# Patient Record
Sex: Female | Born: 1975
Health system: Southern US, Community
[De-identification: ages and names within clinical notes are randomized; demographics above are authoritative.]

## PROBLEM LIST (undated history)

## (undated) DIAGNOSIS — Z789 Other specified health status: Secondary | ICD-10-CM

## (undated) HISTORY — PX: OTHER SURGICAL HISTORY: SHX169

## (undated) HISTORY — PX: SHOULDER SURGERY: SHX246

## (undated) SURGERY — Surgical Case
Anesthesia: *Unknown

---

## 2007-04-14 ENCOUNTER — Other Ambulatory Visit: Admission: RE | Admit: 2007-04-14 | Discharge: 2007-04-14 | Payer: Self-pay | Admitting: Family Medicine

## 2009-06-05 ENCOUNTER — Other Ambulatory Visit: Admission: RE | Admit: 2009-06-05 | Discharge: 2009-06-05 | Payer: Self-pay | Admitting: Family Medicine

## 2010-05-22 ENCOUNTER — Other Ambulatory Visit
Admission: RE | Admit: 2010-05-22 | Discharge: 2010-05-22 | Payer: Self-pay | Source: Home / Self Care | Admitting: Family Medicine

## 2011-05-15 ENCOUNTER — Emergency Department (HOSPITAL_COMMUNITY)
Admission: EM | Admit: 2011-05-15 | Discharge: 2011-05-15 | Disposition: A | Discharge status: Home / Self Care | Payer: 59 | Source: Home / Self Care | Attending: Family Medicine | Admitting: Family Medicine

## 2011-05-15 ENCOUNTER — Encounter: Payer: Self-pay | Admitting: *Deleted

## 2011-05-15 DIAGNOSIS — J069 Acute upper respiratory infection, unspecified: Secondary | ICD-10-CM

## 2011-05-15 LAB — POCT URINALYSIS DIP (DEVICE)
Glucose, UA: NEGATIVE mg/dL
Nitrite: NEGATIVE
Specific Gravity, Urine: 1.02 (ref 1.005–1.030)

## 2011-05-15 MED ORDER — GUAIFENESIN-CODEINE 100-10 MG/5ML PO SYRP: 5.0000 mL | ORAL_SOLUTION | Freq: Four times a day (QID) | ORAL | Status: AC | PRN

## 2011-05-15 NOTE — ED Notes (Addendum)
C/O "high" fevers (unmeasured) with productive cough starting 3 days ago.  Has been taking Zyrtec-D and Motrin as needed.  Reports improvement, but sxs continue.  C/O aching in bilat shoulders and chest when coughing.  Also c/o sharp pain in low abd when coughing; denies urinary sxs.

## 2011-05-15 NOTE — ED Provider Notes (Addendum)
History     CSN: [id number] Arrival date & time: 05/15/2011  9:31 AM   First MD Initiated Contact with Patient 05/15/11 [phone]      Chief Complaint  Patient presents with  . Fever  . Cough    (Consider location/radiation/quality/duration/timing/severity/associated sxs/prior treatment) HPI Comments: Kristen Sheppard presents for evaluation of persistent cough, body aches, headaches over the last 3 days. She reports her parents as sick contacts.   Patient is a 35 y.o. female presenting with fever and cough. The history is provided by the patient.  Fever Primary symptoms of the febrile illness include fever, fatigue, cough and myalgias. Primary symptoms do not include wheezing or shortness of breath. The current episode started 3 to 5 days ago. This is a new problem. The problem has not changed since onset. The fever began 3 to 5 days ago. The fever has been gradually improving since its onset. The maximum temperature recorded prior to her arrival was unknown. The temperature was taken by an oral thermometer.  The fatigue began 3 to 5 days ago.  The cough is non-productive and dry.  Cough Associated symptoms include chills, rhinorrhea and myalgias. Pertinent negatives include no ear pain, no sore throat, no shortness of breath and no wheezing.    History reviewed. No pertinent past medical history.  Past Surgical History  Procedure Date  . Other surgical history     "growth" on shoulder    No family history on file.  History  Substance Use Topics  . Smoking status: Never Smoker   . Smokeless tobacco: Not on file  . Alcohol Use:     OB History    Grav Para Term Preterm Abortions TAB SAB Ect Mult Living                  Review of Systems  Constitutional: Positive for fever, chills and fatigue.  HENT: Positive for congestion, rhinorrhea and sneezing. Negative for ear pain, sore throat and trouble swallowing.   Eyes: Negative.   Respiratory: Positive for cough. Negative for  shortness of breath and wheezing.   Cardiovascular: Negative.   Gastrointestinal: Negative.   Genitourinary: Positive for urgency and frequency.  Musculoskeletal: Positive for myalgias.  Skin: Negative.     Allergies  Review of patient's allergies indicates no known allergies.  Home Medications   Current Outpatient Rx  Name Route Sig Dispense Refill  . GUAIFENESIN-CODEINE 100-10 MG/5ML PO SYRP Oral Take 5 mLs by mouth every 6 (six) hours as needed for cough or congestion. 120 mL 0    BP 122/86  Pulse 54  Temp(Src) 98.6 F (37 C) (Oral)  Resp 18  SpO2 100%  LMP 05/01/2011  Physical Exam  Nursing note and vitals reviewed. Constitutional: She is oriented to person, place, and time. She appears well-developed and well-nourished.  HENT:  Head: Normocephalic and atraumatic.  Right Ear: Tympanic membrane and external ear normal.  Left Ear: Tympanic membrane and external ear normal.  Nose: Nose normal.  Mouth/Throat: Uvula is midline, oropharynx is clear and moist and mucous membranes are normal.  Eyes: EOM are normal. Pupils are equal, round, and reactive to light.  Neck: Normal range of motion.  Cardiovascular: Normal rate and regular rhythm.   Pulmonary/Chest: Effort normal and breath sounds normal. She has no wheezes. She has no rales.  Neurological: She is alert and oriented to person, place, and time.  Skin: Skin is warm and dry.    ED Course  Procedures (including critical care time)  Labs Reviewed  POCT URINALYSIS DIP (DEVICE) - Abnormal; Notable for the following:    Bilirubin Urine SMALL (*)    Ketones, ur 40 (*)    Hgb urine dipstick SMALL (*)    Protein, ur 100 (*)    All other components within normal limits  POCT PREGNANCY, URINE  POCT PREGNANCY, URINE  POCT URINALYSIS DIPSTICK   No results found.   1. URI (upper respiratory infection)       MDM  Labs reviewed.        Richardo Priest, MD 05/15/11 1031  Richardo Priest, MD 05/15/11 1031

## 2012-06-05 ENCOUNTER — Other Ambulatory Visit: Payer: Self-pay | Admitting: Family Medicine

## 2012-06-05 DIAGNOSIS — Z1231 Encounter for screening mammogram for malignant neoplasm of breast: Secondary | ICD-10-CM

## 2012-06-05 DIAGNOSIS — Z803 Family history of malignant neoplasm of breast: Secondary | ICD-10-CM

## 2012-06-29 ENCOUNTER — Ambulatory Visit
Admission: RE | Admit: 2012-06-29 | Discharge: 2012-06-29 | Disposition: A | Discharge status: Home / Self Care | Payer: 59 | Source: Ambulatory Visit | Attending: Family Medicine | Admitting: Family Medicine

## 2012-06-29 DIAGNOSIS — Z1231 Encounter for screening mammogram for malignant neoplasm of breast: Secondary | ICD-10-CM

## 2012-06-29 DIAGNOSIS — Z803 Family history of malignant neoplasm of breast: Secondary | ICD-10-CM

## 2013-06-07 NOTE — L&D Delivery Note (Signed)
Delivery Note At 4:11 PM a viable and healthy female was delivered via Vaginal, Spontaneous Delivery (Presentation: Left Occiput Anterior).  APGAR: 9, 9; weight pending.   Placenta status: Intact, Spontaneous.  Cord: 3 vessels with the following complications: None.  Anesthesia: Epidural  Episiotomy: None Lacerations: 1st degree;Vaginal Suture Repair: 3.0 vicryl 4-0 monocryl Est. Blood Loss (mL):    Mom to postpartum.  Baby to Couplet care / Skin to Skin.  Kileen Lange H. 05/23/2014, 4:45 PM

## 2013-06-08 ENCOUNTER — Other Ambulatory Visit (HOSPITAL_COMMUNITY)
Admission: RE | Admit: 2013-06-08 | Discharge: 2013-06-08 | Disposition: A | Discharge status: Home / Self Care | Payer: 59 | Source: Ambulatory Visit | Attending: Family Medicine | Admitting: Family Medicine

## 2013-06-08 ENCOUNTER — Other Ambulatory Visit: Payer: Self-pay | Admitting: Family Medicine

## 2013-06-08 DIAGNOSIS — Z124 Encounter for screening for malignant neoplasm of cervix: Secondary | ICD-10-CM | POA: Insufficient documentation

## 2013-10-31 ENCOUNTER — Other Ambulatory Visit: Payer: Self-pay | Admitting: Obstetrics and Gynecology

## 2013-11-27 LAB — OB RESULTS CONSOLE GC/CHLAMYDIA
Chlamydia: NEGATIVE
GC PROBE AMP, GENITAL: NEGATIVE

## 2013-11-27 LAB — OB RESULTS CONSOLE RUBELLA ANTIBODY, IGM: Rubella: IMMUNE

## 2013-11-27 LAB — OB RESULTS CONSOLE HEPATITIS B SURFACE ANTIGEN: Hepatitis B Surface Ag: NEGATIVE

## 2013-11-27 LAB — OB RESULTS CONSOLE ABO/RH: RH Type: POSITIVE

## 2013-11-27 LAB — OB RESULTS CONSOLE HIV ANTIBODY (ROUTINE TESTING): HIV: NONREACTIVE

## 2013-11-27 LAB — OB RESULTS CONSOLE ANTIBODY SCREEN: Antibody Screen: NEGATIVE

## 2013-11-27 LAB — OB RESULTS CONSOLE RPR: RPR: NONREACTIVE

## 2014-01-11 ENCOUNTER — Inpatient Hospital Stay (HOSPITAL_COMMUNITY): Admission: AD | Admit: 2014-01-11 | Payer: 59 | Source: Ambulatory Visit | Admitting: Obstetrics and Gynecology

## 2014-04-16 ENCOUNTER — Institutional Professional Consult (permissible substitution): Payer: Self-pay | Admitting: Pediatrics

## 2014-04-30 ENCOUNTER — Other Ambulatory Visit: Payer: Self-pay | Admitting: Obstetrics & Gynecology

## 2014-04-30 LAB — OB RESULTS CONSOLE GBS: GBS: NEGATIVE

## 2014-05-23 ENCOUNTER — Encounter (HOSPITAL_COMMUNITY): Payer: Self-pay

## 2014-05-23 ENCOUNTER — Inpatient Hospital Stay (HOSPITAL_COMMUNITY): Payer: 59 | Admitting: Anesthesiology

## 2014-05-23 ENCOUNTER — Inpatient Hospital Stay (HOSPITAL_COMMUNITY)
Admission: AD | Admit: 2014-05-23 | Discharge: 2014-05-25 | DRG: 775 | Disposition: A | Discharge status: Home / Self Care | Payer: 59 | Source: Ambulatory Visit | Attending: Obstetrics and Gynecology | Admitting: Obstetrics and Gynecology

## 2014-05-23 DIAGNOSIS — Z3A39 39 weeks gestation of pregnancy: Secondary | ICD-10-CM | POA: Diagnosis present

## 2014-05-23 DIAGNOSIS — O09513 Supervision of elderly primigravida, third trimester: Secondary | ICD-10-CM

## 2014-05-23 HISTORY — DX: Other specified health status: Z78.9

## 2014-05-23 LAB — POCT FERN TEST: POCT Fern Test: POSITIVE

## 2014-05-23 LAB — CBC
HEMATOCRIT: 39.1 % (ref 36.0–46.0)
HEMOGLOBIN: 13 g/dL (ref 12.0–15.0)
MCH: 28 pg (ref 26.0–34.0)
MCHC: 33.2 g/dL (ref 30.0–36.0)
MCV: 84.3 fL (ref 78.0–100.0)
Platelets: 237 10*3/uL (ref 150–400)
RBC: 4.64 MIL/uL (ref 3.87–5.11)
RDW: 14.3 % (ref 11.5–15.5)
WBC: 15.6 10*3/uL — ABNORMAL HIGH (ref 4.0–10.5)

## 2014-05-23 LAB — TYPE AND SCREEN
ABO/RH(D): A POS
Antibody Screen: NEGATIVE

## 2014-05-23 LAB — ABO/RH: ABO/RH(D): A POS

## 2014-05-23 LAB — RPR

## 2014-05-23 MED ORDER — INFLUENZA VAC SPLIT QUAD 0.5 ML IM SUSY: 0.5000 mL | PREFILLED_SYRINGE | INTRAMUSCULAR | Status: DC

## 2014-05-23 MED ORDER — DIBUCAINE 1 % RE OINT: 1.0000 "application " | TOPICAL_OINTMENT | RECTAL | Status: DC | PRN

## 2014-05-23 MED ORDER — ONDANSETRON HCL 4 MG/2ML IJ SOLN: 4.0000 mg | INTRAMUSCULAR | Status: DC | PRN

## 2014-05-23 MED ORDER — EPHEDRINE 5 MG/ML INJ: 10.0000 mg | INTRAVENOUS | Status: DC | PRN

## 2014-05-23 MED ORDER — PHENYLEPHRINE 40 MCG/ML (10ML) SYRINGE FOR IV PUSH (FOR BLOOD PRESSURE SUPPORT)
80.0000 ug | PREFILLED_SYRINGE | INTRAVENOUS | Status: DC | PRN
  Filled 2014-05-23: qty 10

## 2014-05-23 MED ORDER — LACTATED RINGERS IV SOLN
500.0000 mL | INTRAVENOUS | Status: DC | PRN
  Administered 2014-05-23: 500 mL via INTRAVENOUS
  Administered 2014-05-23: 300 mL via INTRAVENOUS
  Administered 2014-05-23: 250 mL via INTRAVENOUS

## 2014-05-23 MED ORDER — CITRIC ACID-SODIUM CITRATE 334-500 MG/5ML PO SOLN
30.0000 mL | ORAL | Status: DC | PRN
Start: 2014-05-23 — End: 2014-05-23

## 2014-05-23 MED ORDER — OXYCODONE-ACETAMINOPHEN 5-325 MG PO TABS: 2.0000 | ORAL_TABLET | ORAL | Status: DC | PRN

## 2014-05-23 MED ORDER — SENNOSIDES-DOCUSATE SODIUM 8.6-50 MG PO TABS
2.0000 | ORAL_TABLET | ORAL | Status: DC
  Administered 2014-05-23 – 2014-05-25 (×2): 2 via ORAL
  Filled 2014-05-23 (×2): qty 2

## 2014-05-23 MED ORDER — ONDANSETRON HCL 4 MG/2ML IJ SOLN: 4.0000 mg | Freq: Four times a day (QID) | INTRAMUSCULAR | Status: DC | PRN

## 2014-05-23 MED ORDER — FENTANYL 2.5 MCG/ML BUPIVACAINE 1/10 % EPIDURAL INFUSION (WH - ANES)
14.0000 mL/h | INTRAMUSCULAR | Status: DC | PRN
  Administered 2014-05-23: 14 mL/h via EPIDURAL
  Filled 2014-05-23: qty 125

## 2014-05-23 MED ORDER — IBUPROFEN 600 MG PO TABS
600.0000 mg | ORAL_TABLET | Freq: Four times a day (QID) | ORAL | Status: DC
  Administered 2014-05-23 – 2014-05-25 (×8): 600 mg via ORAL
  Filled 2014-05-23 (×8): qty 1

## 2014-05-23 MED ORDER — OXYTOCIN 40 UNITS IN LACTATED RINGERS INFUSION - SIMPLE MED
1.0000 m[IU]/min | INTRAVENOUS | Status: DC
  Administered 2014-05-23: 2 m[IU]/min via INTRAVENOUS
  Filled 2014-05-23: qty 1000

## 2014-05-23 MED ORDER — DIPHENHYDRAMINE HCL 50 MG/ML IJ SOLN: 12.5000 mg | INTRAMUSCULAR | Status: DC | PRN

## 2014-05-23 MED ORDER — LACTATED RINGERS IV SOLN
INTRAVENOUS | Status: DC
  Administered 2014-05-23 (×3): via INTRAVENOUS

## 2014-05-23 MED ORDER — LACTATED RINGERS IV SOLN: 500.0000 mL | Freq: Once | INTRAVENOUS | Status: DC

## 2014-05-23 MED ORDER — FLEET ENEMA 7-19 GM/118ML RE ENEM: 1.0000 | ENEMA | RECTAL | Status: DC | PRN

## 2014-05-23 MED ORDER — ZOLPIDEM TARTRATE 5 MG PO TABS: 5.0000 mg | ORAL_TABLET | Freq: Every evening | ORAL | Status: DC | PRN

## 2014-05-23 MED ORDER — LANOLIN HYDROUS EX OINT: TOPICAL_OINTMENT | CUTANEOUS | Status: DC | PRN

## 2014-05-23 MED ORDER — LIDOCAINE HCL (PF) 1 % IJ SOLN
INTRAMUSCULAR | Status: DC | PRN
  Administered 2014-05-23 (×2): 8 mL

## 2014-05-23 MED ORDER — TETANUS-DIPHTH-ACELL PERTUSSIS 5-2.5-18.5 LF-MCG/0.5 IM SUSP: 0.5000 mL | Freq: Once | INTRAMUSCULAR | Status: DC

## 2014-05-23 MED ORDER — BENZOCAINE-MENTHOL 20-0.5 % EX AERO: 1.0000 "application " | INHALATION_SPRAY | CUTANEOUS | Status: DC | PRN

## 2014-05-23 MED ORDER — PRENATAL MULTIVITAMIN CH
1.0000 | ORAL_TABLET | Freq: Every day | ORAL | Status: DC
  Administered 2014-05-24 – 2014-05-25 (×2): 1 via ORAL
  Filled 2014-05-23 (×2): qty 1

## 2014-05-23 MED ORDER — OXYCODONE-ACETAMINOPHEN 5-325 MG PO TABS: 1.0000 | ORAL_TABLET | ORAL | Status: DC | PRN

## 2014-05-23 MED ORDER — ONDANSETRON HCL 4 MG PO TABS: 4.0000 mg | ORAL_TABLET | ORAL | Status: DC | PRN

## 2014-05-23 MED ORDER — OXYTOCIN 40 UNITS IN LACTATED RINGERS INFUSION - SIMPLE MED: 62.5000 mL/h | INTRAVENOUS | Status: DC

## 2014-05-23 MED ORDER — LIDOCAINE HCL (PF) 1 % IJ SOLN
30.0000 mL | INTRAMUSCULAR | Status: DC | PRN
  Filled 2014-05-23: qty 30

## 2014-05-23 MED ORDER — PHENYLEPHRINE 40 MCG/ML (10ML) SYRINGE FOR IV PUSH (FOR BLOOD PRESSURE SUPPORT): 80.0000 ug | PREFILLED_SYRINGE | INTRAVENOUS | Status: DC | PRN

## 2014-05-23 MED ORDER — TERBUTALINE SULFATE 1 MG/ML IJ SOLN: 0.2500 mg | Freq: Once | INTRAMUSCULAR | Status: DC | PRN

## 2014-05-23 MED ORDER — ACETAMINOPHEN 325 MG PO TABS: 650.0000 mg | ORAL_TABLET | ORAL | Status: DC | PRN

## 2014-05-23 MED ORDER — OXYTOCIN BOLUS FROM INFUSION
500.0000 mL | INTRAVENOUS | Status: DC
  Administered 2014-05-23: 500 mL via INTRAVENOUS

## 2014-05-23 MED ORDER — DIPHENHYDRAMINE HCL 25 MG PO CAPS: 25.0000 mg | ORAL_CAPSULE | Freq: Four times a day (QID) | ORAL | Status: DC | PRN

## 2014-05-23 MED ORDER — WITCH HAZEL-GLYCERIN EX PADS: 1.0000 "application " | MEDICATED_PAD | CUTANEOUS | Status: DC | PRN

## 2014-05-23 MED ORDER — FENTANYL 2.5 MCG/ML BUPIVACAINE 1/10 % EPIDURAL INFUSION (WH - ANES)
INTRAMUSCULAR | Status: DC | PRN
  Administered 2014-05-23: 14 mL/h via EPIDURAL

## 2014-05-23 MED ORDER — SIMETHICONE 80 MG PO CHEW: 80.0000 mg | CHEWABLE_TABLET | ORAL | Status: DC | PRN

## 2014-05-23 NOTE — H&P (Signed)
Tynisa Grigg is a 38 y.o. female presenting for leaking fluid  38 Yo G1P0 @ 39+3 presents to MAU c/o leaking fluid and was confirmed to be leaking amniotic fluid. Had ultrasound @ 37 weeks that showed oligohydramnios with an AFI of 7.85 and an enlarged fetal bladder, FU AFI 4 days later was normal @ 11.26. Her husband is Hep B+ but the patient has tested negative on multiple occassions History OB History    Gravida Para Term Preterm AB TAB SAB Ectopic Multiple Living   1              Past Medical History  Diagnosis Date  . Medical history non-contributory    Past Surgical History  Procedure Laterality Date  . Other surgical history      "growth" on shoulder  . Shoulder surgery Right    Family History: family history is not on file. Social History:  reports that she has never smoked. She does not have any smokeless tobacco history on file. She reports that she does not drink alcohol or use illicit drugs.   Prenatal Transfer Tool  Maternal Diabetes: No Genetic Screening: Normal Maternal Ultrasounds/Referrals: Normal except for oligohydramnios that resolved after 4 days. Fetal bladder also normal appearing on 2nd US Fetal Ultrasounds or other Referrals:  None Maternal Substance Abuse:  No Significant Maternal Medications:  None Significant Maternal Lab Results:  None Other Comments:  None  ROS: as above  Dilation: 7.5 Effacement (%): 100 Station: 0 Exam by:: foley,rn Blood pressure 113/63, pulse 71, temperature 98 F (36.7 C), temperature source Oral, resp. rate 20, height 5' (1.524 m), weight 62.596 kg (138 lb), SpO2 99 %. Exam Physical Exam  Prenatal labs: ABO, Rh: --/--/A POS, A POS (12/17 0330) Antibody: NEG (12/17 0330) Rubella: Immune (06/23 0000) RPR: Nonreactive (06/23 0000)  HBsAg: Negative (06/23 0000)  HIV: Non-reactive (06/23 0000)  GBS: Negative (11/24 0000)   Assessment/Plan: 1) Admit 2) Epidural on request 3) Pitocin Augmentation as  needed   Agastya Meister H. 05/23/2014, 11:39 AM

## 2014-05-23 NOTE — MAU Provider Note (Signed)
Ms. Kristen Sheppard is a 38 y.o. G1P0 at 1556w3d  who presents to MAU today complaining contractions and LOF since 0215. The patient denies complication with the pregnancy. She denies vaginal bleeding today. She reports good fetal movement.   BP 121/83 mmHg  Pulse 88  Temp(Src) 98.3 F (36.8 C) (Oral)  Resp 20  Ht 5' (1.524 m)  Wt 138 lb (62.596 kg)  BMI 26.95 kg/m2 GENERAL: Well-developed, well-nourished female in no acute distress.  HEENT: Normocephalic, atraumatic.  LUNGS: Normal effort HEART: Regular rate  ABDOMEN: Soft, nontender, nondistended.  PELVIC: Normal external female genitalia. Vagina is pink and rugated.  Normal discharge.  positive pooling. Gravid uterus.   EXTREMITIES: No cyanosis, clubbing, or edema Dilation: 2.5 Effacement (%): 50 Cervical Position: Middle Station: -3 Presentation: Vertex Exam by:: Judeth HornErin Lawrence RNC  Fern - positive  Fetal Monitoring:  Baseline: 130 bpm, moderate variability, + accelerations, no decelerations Contractions: q 3-4 minutes  A: SROM  P: Report given to RN to contact MD on call for further instructions  Marny LowensteinJulie N Dawood Spitler, PA-C 05/23/2014 3:12 AM

## 2014-05-23 NOTE — Anesthesia Procedure Notes (Signed)
Epidural Patient location during procedure: OB Start time: 05/23/2014 8:17 AM End time: 05/23/2014 8:21 AM  Staffing Anesthesiologist: Leilani AbleHATCHETT, Hassan Blackshire Performed by: anesthesiologist   Preanesthetic Checklist Completed: patient identified, surgical consent, pre-op evaluation, timeout performed, IV checked, risks and benefits discussed and monitors and equipment checked  Epidural Patient position: sitting Prep: site prepped and draped and DuraPrep Patient monitoring: continuous pulse ox and blood pressure Approach: midline Location: L3-L4 Injection technique: LOR air  Needle:  Needle type: Tuohy  Needle gauge: 17 G Needle length: 9 cm and 9 Needle insertion depth: 4 cm Catheter type: closed end flexible Catheter size: 19 Gauge Catheter at skin depth: 10 and 7 cm Test dose: negative and Other  Assessment Sensory level: T10 Events: blood not aspirated, injection not painful, no injection resistance, negative IV test and no paresthesia  Additional Notes Reason for block:procedure for pain

## 2014-05-23 NOTE — Anesthesia Preprocedure Evaluation (Signed)

## 2014-05-23 NOTE — MAU Note (Signed)
Leaking fluid since 215 am this morning. Contractions tonight. Denies vaginal bleeding. Positive fetal movement.

## 2014-05-24 LAB — CBC
HCT: 30 % — ABNORMAL LOW (ref 36.0–46.0)
MCH: 28.2 pg (ref 26.0–34.0)
MCHC: 33.3 g/dL (ref 30.0–36.0)
MCV: 84.5 fL (ref 78.0–100.0)
Platelets: 179 10*3/uL (ref 150–400)
RBC: 3.55 MIL/uL — AB (ref 3.87–5.11)
RDW: 14.2 % (ref 11.5–15.5)
WBC: 16.6 10*3/uL — ABNORMAL HIGH (ref 4.0–10.5)

## 2014-05-24 NOTE — Lactation Note (Signed)
This note was copied from the chart of Kristen Sheppard. Lactation Consultation Note New mom has small breast, latches better on Lt. Breast per mom d/t Rt. Nipple doesn't evert as much as Lt. Nipple shells given to be worn between feedings in bra. Mom appears to be receptive to suggestions. Instructed to roll nipples in finger tips to help evert more. hand expressions taught and got out 3ml. On foley cup. Hand pump given and encouraged to pre-pump Rt. Nipple to help evert more for a deeper latch, baby has small mouth so needs to be adjusted and assist in chin. Baby pops off and on frequently. Mom has occassionally pinching. No bruising noted at this time to nipples. Encouraged a deep latch. Discussed different positioning options. Mom encouraged to feed baby 8-12 times/24 hours and with feeding cues.  Educated about newborn behavior. Encouraged comfort during BF so colostrum flows better and mom will enjoy the feeding longer. Taking deep breaths and breast massage during BF. Referred to Baby and Me Book in Breastfeeding section Pg. 22-23 for position options and Proper latch demonstration.Mom encouraged to do skin-to-skin.Mom encouraged to waken baby for feeds. WH/LC brochure given w/resources, support groups and LC services.  Patient Name: Kristen Maryagnes AmosBounpasert Felch WUJWJ'XToday's Date: 05/24/2014 Reason for consult: Initial assessment   Maternal Data Has patient been taught Hand Expression?: Yes Does the patient have breastfeeding experience prior to this delivery?: No  Feeding Feeding Type: Breast Fed Length of feed: 20 min  LATCH Score/Interventions Latch: Repeated attempts needed to sustain latch, nipple held in mouth throughout feeding, stimulation needed to elicit sucking reflex. Intervention(s): Adjust position;Assist with latch;Breast massage;Breast compression  Audible Swallowing: A few with stimulation Intervention(s): Skin to skin;Hand expression;Alternate breast  massage  Type of Nipple: Everted at rest and after stimulation  Comfort (Breast/Nipple): Soft / non-tender     Hold (Positioning): Assistance needed to correctly position infant at breast and maintain latch. Intervention(s): Breastfeeding basics reviewed;Support Pillows;Position options;Skin to skin  LATCH Score: 7  Lactation Tools Discussed/Used Tools: Shells;Pump Shell Type: Inverted Breast pump type: Manual Pump Review: Milk Storage;Setup, frequency, and cleaning Initiated by:: Peri JeffersonL. Brix Brearley RN Date initiated:: 05/24/14   Consult Status Consult Status: Follow-up Date: 07/26/13 Follow-up type: In-patient    Charyl DancerCARVER, Asianna Brundage G 05/24/2014, 4:39 AM

## 2014-05-24 NOTE — Anesthesia Postprocedure Evaluation (Signed)
Anesthesia Post Note  Patient: Kristen Sheppard  Procedure(s) Performed: * No procedures listed *  Anesthesia type: Epidural  Patient location: Mother/Baby  Post pain: Pain level controlled  Post assessment: Post-op Vital signs reviewed  Last Vitals:  Filed Vitals:   05/24/14 0549  BP: 118/70  Pulse: 85  Temp: 36.7 C  Resp:     Post vital signs: Reviewed  Level of consciousness: awake  Complications: No apparent anesthesia complications

## 2014-05-24 NOTE — Progress Notes (Signed)
Post Partum Day 1 Subjective: no complaints, up ad lib, voiding, tolerating PO, + flatus and breastfeeding  Objective: Blood pressure 118/70, pulse 85, temperature 98 F (36.7 C), temperature source Oral, resp. rate 16, height 5' (1.524 m), weight 62.596 kg (138 lb), SpO2 98 %, unknown if currently breastfeeding.  Physical Exam:  General: alert, cooperative, appears stated age and no distress Lochia: appropriate Uterine Fundus: firm perineum: healing well, no significant drainage, no dehiscence DVT Evaluation: No evidence of DVT seen on physical exam. Negative Homan's sign. No cords or calf tenderness.   Recent Labs  05/23/14 0330 05/24/14 0605  HGB 13.0 REPEATED TO VERIFY  HCT 39.1 30.0*    Assessment/Plan: Plan for discharge tomorrow and Breastfeeding   LOS: 1 day   Kenyona Rena STACIA 05/24/2014, 9:43 AM

## 2014-05-24 NOTE — Lactation Note (Signed)
This note was copied from the chart of Kristen Danialle Tsang. Lactation Consultation Note  Patient Name: Kristen Sheppard ZOXWR'UToday's Date: 05/24/2014 Reason for consult: Follow-up assessment Mom reports baby has been sleepy and not wanting to BF this morning. Baby has had several feedings during the evening/night. Mom hand expressed and cup fed approx 1 ml of colostrum since baby would not latch. At this visit, baby easily awakened. Mom initially attempted to latch in football hold however baby was not sustaining good depth. Changed to cross cradle and baby was able to latch sustaining a good suckling pattern. Some audible swallows. Basic teaching reviewed. Advised after 24 hours baby should be at the breast 8-12 times in 24 hours. Cluster feeding discussed. Encouraged Mom to call for assist if baby not waking to BF or will not latch.   Maternal Data    Feeding Feeding Type: Breast Fed  LATCH Score/Interventions Latch: Grasps breast easily, tongue down, lips flanged, rhythmical sucking.  Audible Swallowing: A few with stimulation  Type of Nipple: Everted at rest and after stimulation  Comfort (Breast/Nipple): Soft / non-tender     Hold (Positioning): Assistance needed to correctly position infant at breast and maintain latch. Intervention(s): Breastfeeding basics reviewed;Support Pillows;Position options;Skin to skin  LATCH Score: 8  Lactation Tools Discussed/Used     Consult Status Consult Status: Follow-up Date: 05/25/14 Follow-up type: In-patient    Alfred LevinsGranger, Tausha Milhoan Ann 05/24/2014, 2:30 PM

## 2014-05-25 ENCOUNTER — Ambulatory Visit: Payer: Self-pay

## 2014-05-25 ENCOUNTER — Encounter (HOSPITAL_COMMUNITY): Payer: Self-pay | Admitting: *Deleted

## 2014-05-25 MED ORDER — IBUPROFEN 600 MG PO TABS: 600.0000 mg | ORAL_TABLET | Freq: Four times a day (QID) | ORAL | Status: AC | PRN

## 2014-05-25 NOTE — Progress Notes (Signed)
Post Partum Day 2 Subjective: no complaints, up ad lib, voiding and tolerating PO  Objective: Blood pressure 115/68, pulse 67, temperature 97.8 F (36.6 C), temperature source Oral, resp. rate 18, height 5' (1.524 m), weight 62.596 kg (138 lb), SpO2 98 %, unknown if currently breastfeeding.  Physical Exam:  General: alert, cooperative and no distress Lochia: appropriate Uterine Fundus: firm DVT Evaluation: No evidence of DVT seen on physical exam. Negative Homan's sign. No cords or calf tenderness.   Recent Labs  05/23/14 0330 05/24/14 0605  HGB 13.0 REPEATED TO VERIFY  HCT 39.1 30.0*    Assessment/Plan: Discharge home and Breastfeeding   LOS: 2 days   Denyse Fillion STACIA 05/25/2014, 8:21 AM

## 2014-05-25 NOTE — Lactation Note (Signed)
This note was copied from the chart of Girl Colisha Michael. Lactation Consultation Note  Patient Name: Girl Maryagnes AmosBounpasert Scholes ZOXWR'UToday's Date: 05/25/2014 Reason for consult: Follow-up assessment;Hyperbilirubinemia;Infant < 6lbs Baby 42 hours of life. Mom reports baby just finished nursing. Mom denies nipple pain and is able to easily express colostrum. Enc mom to call out at next feeding for LC to see a latch due to baby's elevated bilirubin level.  Maternal Data    Feeding Feeding Type: Breast Fed Length of feed: 10 min  LATCH Score/Interventions Latch: Grasps breast easily, tongue down, lips flanged, rhythmical sucking.  Audible Swallowing: A few with stimulation  Type of Nipple: Everted at rest and after stimulation  Comfort (Breast/Nipple): Soft / non-tender     Hold (Positioning): No assistance needed to correctly position infant at breast.  LATCH Score: 9  Lactation Tools Discussed/Used     Consult Status Consult Status: PRN Follow-up type: In-patient    Geralynn OchsWILLIARD, Giulio Bertino 05/25/2014, 12:12 PM

## 2014-05-25 NOTE — Discharge Summary (Signed)
Obstetric Discharge Summary Reason for Admission: onset of labor Prenatal Procedures: NST Intrapartum Procedures: spontaneous vaginal delivery Postpartum Procedures: none Complications-Operative and Postpartum: none HEMOGLOBIN  Date Value Ref Range Status  05/24/2014 REPEATED TO VERIFY 12.0 - 15.0 g/dL Final    Comment:    DELTA CHECK NOTED   HCT  Date Value Ref Range Status  05/24/2014 30.0* 36.0 - 46.0 % Final    Physical Exam:  General: alert, cooperative and no distress Lochia: appropriate Uterine Fundus: firm DVT Evaluation: No evidence of DVT seen on physical exam. Negative Homan's sign. No cords or calf tenderness.  Discharge Diagnoses: Term Pregnancy-delivered  Discharge Information: Date: 05/25/2014 Activity: pelvic rest Diet: routine Medications: Ibuprofen Condition: stable Instructions: refer to practice specific booklet Discharge to: home   Newborn Data: Live born female  Birth Weight: 6 lb 4.7 oz (2855 g) APGAR: 9, 9  Home with mother.  Essie HartINN, Keaundre Thelin STACIA 05/25/2014, 8:24 AM

## 2014-05-25 NOTE — Lactation Note (Signed)
This note was copied from the chart of Kristen Sheppard. Lactation Consultation Note  Patient Name: Kristen Sheppard AVWUJ'WToday's Date: 05/25/2014 Reason for consult: Follow-up assessment Baby 44 hours of life. Baby staying due to hyperbilirubinemia. Set mom up with DEBP. Mom able to express drops, but dried before finished pumping. Enc mom to give drops to baby as able. Mom wanting to only give EBM for supplementation at this time. Assisted mom to hand express drops of colostrum and demonstrated to mom how to give to baby. Discussed with mom that baby is sleepy due to high level of bilirubin. Discussed with mom the need to enc baby to nurse often by keeping baby close to breasts, while still on phototherapy. Demonstrated to mom that baby was not cueing to nurse, but after attempting, baby nursed eagerly.   Plan is for mom to offer breast, hand expressing before latching baby to enc baby to latch deeply. Then to supplement with EBM or formula according to supplementation guidelines. Mom wants to offer only EBM for now. Discussed with mom that she needs to feed earlier rather than later, waiting no more than 3 hours between feedings, and if after using DEBP 3 times total she is not seeing increases in EBM, she should supplement with formula. Plan is also to post-pump after each feeding, followed by hand expression.   Discussed assessment, interventions, and plan with patient's [facility] RN. Enc mom to call out for assistance as needed. Also discussed methods of giving EBM to baby using finger and curve-tipped syringe.   Maternal Data    Feeding Feeding Type: Breast Fed Length of feed:  (LC assessed first 15 minutes of breastfeed. )  LATCH Score/Interventions Latch: Grasps breast easily, tongue down, lips flanged, rhythmical sucking. Intervention(s): Assist with latch;Breast compression  Audible Swallowing: A few with stimulation Intervention(s): Hand expression;Skin to  skin  Type of Nipple: Everted at rest and after stimulation  Comfort (Breast/Nipple): Soft / non-tender     Hold (Positioning): No assistance needed to correctly position infant at breast. Intervention(s): Breastfeeding basics reviewed;Support Pillows  LATCH Score: 9  Lactation Tools Discussed/Used Pump Review: Setup, frequency, and cleaning;Milk Storage Initiated by:: JW Date initiated:: 05/25/14   Consult Status Consult Status: Follow-up Date: 05/26/14 Follow-up type: In-patient    Kristen Sheppard 05/25/2014, 12:21 PM

## 2014-05-26 ENCOUNTER — Ambulatory Visit: Payer: Self-pay

## 2014-05-26 NOTE — Lactation Note (Signed)
This note was copied from the chart of Kristen Sheppard. Lactation Consultation Note  Patient Name: Kristen Maryagnes AmosBounpasert Geron WNUUV'OToday's Date: 05/26/2014 Reason for consult: Follow-up assessment;Hyperbilirubinemia Baby 65 hours of life. [facility]. Baby deeply latch, suckling rhythmically with a few swallows noted. Mom states that she has been putting baby to breasts in between supplementing, and baby has nurse for up to an hour at a time. Enc mom to put baby to breast first, then to supplement and post-pump. Mom states that she has been pumping, except for sleeping after 2am last night, and so far is still just getting drops. Enc mom to continue her nursing and pumping routine, and discussed supply and demand and when milk usually comes in. Parents state that they will call insurance about DEBP on Monday. Discussed 2-week rental with hospital pump and gave paper-work to fill out. Parents will call for pump if baby is D/C'd today.   Maternal Data    Feeding Feeding Type:  ([facility] Nipple Type: Slow - flow  LATCH Score/Interventions Latch: Grasps breast easily, tongue down, lips flanged, rhythmical sucking.  Audible Swallowing: A few with stimulation  Type of Nipple: Everted at rest and after stimulation  Comfort (Breast/Nipple): Soft / non-tender     Hold (Positioning): No assistance needed to correctly position infant at breast.  [facility] LLC[facility] Score: 9  Lactation Tools Discussed/Used     Consult Status      Geralynn OchsWILLIARD, Taliah Porche 05/26/2014, 9:25 AM

## 2014-05-26 NOTE — Lactation Note (Signed)
This note was copied from the chart of Kristen Britton Doughman. Lactation Consultation Note  Patient Name: Kristen Sheppard ZOXWR'UToday's Date: 05/26/2014 Reason for consult: Follow-up assessment Baby 66 hours of life. Parents given DEBP rental. Reviewed engorgement prevention/treatment. Mom aware of OP/BFSG and LC phone line assistance after D/C. Referred parents to Baby and Me booklet for number of diapers to expect by day of life and EBM storage guidelines. Enc mom to call for assistance as needed.   Maternal Data    Feeding Feeding Type:  ([facility] Nipple Type: Slow - flow  LATCH Score/Interventions Latch: Grasps breast easily, tongue down, lips flanged, rhythmical sucking.  Audible Swallowing: A few with stimulation  Type of Nipple: Everted at rest and after stimulation  Comfort (Breast/Nipple): Soft / non-tender     Hold (Positioning): No assistance needed to correctly position infant at breast.  LATCH Score: 9  Lactation Tools Discussed/Used     Consult Status Consult Status: Complete Follow-up type: In-patient    Kristen Sheppard, Kristen Sheppard 05/26/2014, 10:17 AM

## 2015-03-05 ENCOUNTER — Other Ambulatory Visit: Payer: Self-pay | Admitting: Obstetrics and Gynecology

## 2015-03-05 LAB — OB RESULTS CONSOLE ANTIBODY SCREEN: Antibody Screen: NEGATIVE

## 2015-03-05 LAB — OB RESULTS CONSOLE RUBELLA ANTIBODY, IGM: RUBELLA: IMMUNE

## 2015-03-05 LAB — OB RESULTS CONSOLE ABO/RH: RH Type: POSITIVE

## 2015-03-05 LAB — OB RESULTS CONSOLE GC/CHLAMYDIA
CHLAMYDIA, DNA PROBE: NEGATIVE
GC PROBE AMP, GENITAL: NEGATIVE

## 2015-03-05 LAB — OB RESULTS CONSOLE HIV ANTIBODY (ROUTINE TESTING): HIV: NONREACTIVE

## 2015-03-05 LAB — OB RESULTS CONSOLE HEPATITIS B SURFACE ANTIGEN: HEP B S AG: NEGATIVE

## 2015-03-05 LAB — OB RESULTS CONSOLE RPR: RPR: NONREACTIVE

## 2015-03-07 LAB — CYTOLOGY - PAP

## 2015-06-08 NOTE — L&D Delivery Note (Signed)
Patient was C/C/+3 and pushed for 15 minutes with epidural.   NSVD  female infant, Apgars 9,9, weight P.   The patient had a first degree midline laceration perineal repaired with 2-0 vicryl R. Fundus was firm. EBL was expected amount. Placenta was delivered intact. Vagina was clear.  Baby was vigorous and doing skin to skin with mother.  Parents desire circ in hospital.  Pt desires pp BTL- npo after MN.  Walter Min A

## 2015-07-29 ENCOUNTER — Other Ambulatory Visit: Payer: Self-pay | Admitting: Obstetrics and Gynecology

## 2015-07-29 LAB — OB RESULTS CONSOLE GBS: GBS: POSITIVE

## 2015-08-07 ENCOUNTER — Inpatient Hospital Stay (HOSPITAL_COMMUNITY): Admission: AD | Admit: 2015-08-07 | Payer: Self-pay | Source: Ambulatory Visit | Admitting: Obstetrics and Gynecology

## 2015-08-19 ENCOUNTER — Telehealth (HOSPITAL_COMMUNITY): Payer: Self-pay | Admitting: *Deleted

## 2015-08-19 ENCOUNTER — Encounter (HOSPITAL_COMMUNITY): Payer: Self-pay | Admitting: *Deleted

## 2015-08-19 ENCOUNTER — Other Ambulatory Visit: Payer: Self-pay | Admitting: Obstetrics and Gynecology

## 2015-08-19 NOTE — Telephone Encounter (Signed)
Preadmission screen  

## 2015-08-22 ENCOUNTER — Encounter (HOSPITAL_COMMUNITY): Payer: Self-pay | Admitting: *Deleted

## 2015-08-22 ENCOUNTER — Telehealth (HOSPITAL_COMMUNITY): Payer: Self-pay | Admitting: *Deleted

## 2015-08-22 NOTE — Telephone Encounter (Signed)
Preadmission screen  

## 2015-08-26 ENCOUNTER — Encounter (HOSPITAL_COMMUNITY): Payer: Self-pay

## 2015-08-26 ENCOUNTER — Inpatient Hospital Stay (HOSPITAL_COMMUNITY): Payer: Commercial Managed Care - PPO | Admitting: Anesthesiology

## 2015-08-26 ENCOUNTER — Inpatient Hospital Stay (HOSPITAL_COMMUNITY)
Admission: AD | Admit: 2015-08-26 | Discharge: 2015-08-28 | DRG: 767 | Disposition: A | Discharge status: Home / Self Care | Payer: Commercial Managed Care - PPO | Source: Ambulatory Visit | Attending: Obstetrics and Gynecology | Admitting: Obstetrics and Gynecology

## 2015-08-26 DIAGNOSIS — Z302 Encounter for sterilization: Secondary | ICD-10-CM | POA: Diagnosis not present

## 2015-08-26 DIAGNOSIS — Z3A39 39 weeks gestation of pregnancy: Secondary | ICD-10-CM | POA: Diagnosis not present

## 2015-08-26 DIAGNOSIS — Z3490 Encounter for supervision of normal pregnancy, unspecified, unspecified trimester: Secondary | ICD-10-CM

## 2015-08-26 DIAGNOSIS — O99824 Streptococcus B carrier state complicating childbirth: Secondary | ICD-10-CM | POA: Diagnosis present

## 2015-08-26 LAB — TYPE AND SCREEN
ABO/RH(D): A POS
ANTIBODY SCREEN: NEGATIVE

## 2015-08-26 LAB — CBC
HEMATOCRIT: 40.2 % (ref 36.0–46.0)
HEMOGLOBIN: 13.3 g/dL (ref 12.0–15.0)
MCH: 27.7 pg (ref 26.0–34.0)
MCHC: 33.1 g/dL (ref 30.0–36.0)
MCV: 83.6 fL (ref 78.0–100.0)
Platelets: 272 10*3/uL (ref 150–400)
RBC: 4.81 MIL/uL (ref 3.87–5.11)
RDW: 14 % (ref 11.5–15.5)
WBC: 14.3 10*3/uL — ABNORMAL HIGH (ref 4.0–10.5)

## 2015-08-26 LAB — RPR: RPR: NONREACTIVE

## 2015-08-26 MED ORDER — ONDANSETRON HCL 4 MG/2ML IJ SOLN: 4.0000 mg | INTRAMUSCULAR | Status: DC | PRN

## 2015-08-26 MED ORDER — MEASLES, MUMPS & RUBELLA VAC ~~LOC~~ INJ
0.5000 mL | INJECTION | Freq: Once | SUBCUTANEOUS | Status: DC
  Filled 2015-08-26: qty 0.5

## 2015-08-26 MED ORDER — EPHEDRINE 5 MG/ML INJ
10.0000 mg | INTRAVENOUS | Status: DC | PRN
  Administered 2015-08-26: 10 mg via INTRAVENOUS
  Filled 2015-08-26: qty 2

## 2015-08-26 MED ORDER — PRENATAL MULTIVITAMIN CH
1.0000 | ORAL_TABLET | Freq: Every day | ORAL | Status: DC
  Administered 2015-08-28: 1 via ORAL
  Filled 2015-08-26: qty 1

## 2015-08-26 MED ORDER — FENTANYL 2.5 MCG/ML BUPIVACAINE 1/10 % EPIDURAL INFUSION (WH - ANES)
14.0000 mL/h | INTRAMUSCULAR | Status: DC | PRN
  Administered 2015-08-26 (×2): 14 mL/h via EPIDURAL
  Filled 2015-08-26 (×2): qty 125

## 2015-08-26 MED ORDER — EPHEDRINE 5 MG/ML INJ
10.0000 mg | INTRAVENOUS | Status: DC | PRN
  Administered 2015-08-26: 10 mg via INTRAVENOUS
  Filled 2015-08-26: qty 2
  Filled 2015-08-26: qty 4

## 2015-08-26 MED ORDER — ZOLPIDEM TARTRATE 5 MG PO TABS: 5.0000 mg | ORAL_TABLET | Freq: Every evening | ORAL | Status: DC | PRN

## 2015-08-26 MED ORDER — OXYCODONE-ACETAMINOPHEN 5-325 MG PO TABS: 2.0000 | ORAL_TABLET | ORAL | Status: DC | PRN

## 2015-08-26 MED ORDER — SODIUM CHLORIDE 0.9% FLUSH: 3.0000 mL | INTRAVENOUS | Status: DC | PRN

## 2015-08-26 MED ORDER — METHYLERGONOVINE MALEATE 0.2 MG PO TABS: 0.2000 mg | ORAL_TABLET | ORAL | Status: DC | PRN

## 2015-08-26 MED ORDER — PENICILLIN G POTASSIUM 5000000 UNITS IJ SOLR
5.0000 10*6.[IU] | Freq: Once | INTRAMUSCULAR | Status: AC
  Administered 2015-08-26: 5 10*6.[IU] via INTRAVENOUS
  Filled 2015-08-26: qty 5

## 2015-08-26 MED ORDER — DIPHENHYDRAMINE HCL 25 MG PO CAPS: 25.0000 mg | ORAL_CAPSULE | Freq: Four times a day (QID) | ORAL | Status: DC | PRN

## 2015-08-26 MED ORDER — PHENYLEPHRINE 40 MCG/ML (10ML) SYRINGE FOR IV PUSH (FOR BLOOD PRESSURE SUPPORT)
80.0000 ug | PREFILLED_SYRINGE | INTRAVENOUS | Status: DC | PRN
  Filled 2015-08-26: qty 20
  Filled 2015-08-26: qty 2

## 2015-08-26 MED ORDER — TETANUS-DIPHTH-ACELL PERTUSSIS 5-2.5-18.5 LF-MCG/0.5 IM SUSP: 0.5000 mL | Freq: Once | INTRAMUSCULAR | Status: DC

## 2015-08-26 MED ORDER — FLEET ENEMA 7-19 GM/118ML RE ENEM: 1.0000 | ENEMA | RECTAL | Status: DC | PRN

## 2015-08-26 MED ORDER — LACTATED RINGERS IV SOLN
1.0000 m[IU]/min | INTRAVENOUS | Status: DC
  Administered 2015-08-26: 2 m[IU]/min via INTRAVENOUS
  Filled 2015-08-26: qty 10

## 2015-08-26 MED ORDER — LACTATED RINGERS IV SOLN
500.0000 mL | INTRAVENOUS | Status: DC | PRN
  Administered 2015-08-26: 500 mL via INTRAVENOUS

## 2015-08-26 MED ORDER — IBUPROFEN 800 MG PO TABS
800.0000 mg | ORAL_TABLET | Freq: Three times a day (TID) | ORAL | Status: DC
  Administered 2015-08-27 – 2015-08-28 (×4): 800 mg via ORAL
  Filled 2015-08-26 (×4): qty 1

## 2015-08-26 MED ORDER — PENICILLIN G POTASSIUM 5000000 UNITS IJ SOLR
2.5000 10*6.[IU] | INTRAVENOUS | Status: DC
  Administered 2015-08-26 (×2): 2.5 10*6.[IU] via INTRAVENOUS
  Filled 2015-08-26 (×5): qty 2.5

## 2015-08-26 MED ORDER — ONDANSETRON HCL 4 MG/2ML IJ SOLN
4.0000 mg | Freq: Four times a day (QID) | INTRAMUSCULAR | Status: DC | PRN
  Administered 2015-08-26: 4 mg via INTRAVENOUS
  Filled 2015-08-26: qty 2

## 2015-08-26 MED ORDER — OXYCODONE-ACETAMINOPHEN 5-325 MG PO TABS
2.0000 | ORAL_TABLET | ORAL | Status: DC | PRN
  Administered 2015-08-27: 2 via ORAL
  Filled 2015-08-26: qty 2

## 2015-08-26 MED ORDER — DIBUCAINE 1 % RE OINT: 1.0000 "application " | TOPICAL_OINTMENT | RECTAL | Status: DC | PRN

## 2015-08-26 MED ORDER — CITRIC ACID-SODIUM CITRATE 334-500 MG/5ML PO SOLN: 30.0000 mL | ORAL | Status: DC | PRN

## 2015-08-26 MED ORDER — LANOLIN HYDROUS EX OINT: TOPICAL_OINTMENT | CUTANEOUS | Status: DC | PRN

## 2015-08-26 MED ORDER — OXYCODONE-ACETAMINOPHEN 5-325 MG PO TABS: 1.0000 | ORAL_TABLET | ORAL | Status: DC | PRN

## 2015-08-26 MED ORDER — SODIUM CHLORIDE 0.9% FLUSH: 3.0000 mL | Freq: Two times a day (BID) | INTRAVENOUS | Status: DC

## 2015-08-26 MED ORDER — LACTATED RINGERS IV SOLN
INTRAVENOUS | Status: DC
  Administered 2015-08-26 (×3): via INTRAVENOUS

## 2015-08-26 MED ORDER — SODIUM CHLORIDE 0.9 % IV SOLN: 250.0000 mL | INTRAVENOUS | Status: DC | PRN

## 2015-08-26 MED ORDER — WITCH HAZEL-GLYCERIN EX PADS: 1.0000 "application " | MEDICATED_PAD | CUTANEOUS | Status: DC | PRN

## 2015-08-26 MED ORDER — OXYTOCIN BOLUS FROM INFUSION
500.0000 mL | INTRAVENOUS | Status: DC
  Administered 2015-08-26: 500 mL via INTRAVENOUS

## 2015-08-26 MED ORDER — MAGNESIUM HYDROXIDE 400 MG/5ML PO SUSP: 30.0000 mL | ORAL | Status: DC | PRN

## 2015-08-26 MED ORDER — PHENYLEPHRINE 40 MCG/ML (10ML) SYRINGE FOR IV PUSH (FOR BLOOD PRESSURE SUPPORT)
80.0000 ug | PREFILLED_SYRINGE | INTRAVENOUS | Status: DC | PRN
Start: 2015-08-26 — End: 2015-08-26
  Filled 2015-08-26: qty 2
  Filled 2015-08-26: qty 20

## 2015-08-26 MED ORDER — DIPHENHYDRAMINE HCL 50 MG/ML IJ SOLN: 12.5000 mg | INTRAMUSCULAR | Status: DC | PRN

## 2015-08-26 MED ORDER — TERBUTALINE SULFATE 1 MG/ML IJ SOLN
0.2500 mg | Freq: Once | INTRAMUSCULAR | Status: DC | PRN
  Filled 2015-08-26: qty 1

## 2015-08-26 MED ORDER — OXYTOCIN 10 UNIT/ML IJ SOLN
2.5000 [IU]/h | INTRAVENOUS | Status: DC
  Administered 2015-08-26: 2.5 [IU]/h via INTRAVENOUS

## 2015-08-26 MED ORDER — FERROUS SULFATE 325 (65 FE) MG PO TABS
325.0000 mg | ORAL_TABLET | Freq: Two times a day (BID) | ORAL | Status: DC
  Administered 2015-08-27 – 2015-08-28 (×3): 325 mg via ORAL
  Filled 2015-08-26 (×3): qty 1

## 2015-08-26 MED ORDER — ACETAMINOPHEN 325 MG PO TABS: 650.0000 mg | ORAL_TABLET | ORAL | Status: DC | PRN

## 2015-08-26 MED ORDER — LIDOCAINE HCL (PF) 1 % IJ SOLN
30.0000 mL | INTRAMUSCULAR | Status: DC | PRN
  Filled 2015-08-26: qty 30

## 2015-08-26 MED ORDER — ONDANSETRON HCL 4 MG PO TABS: 4.0000 mg | ORAL_TABLET | ORAL | Status: DC | PRN

## 2015-08-26 MED ORDER — BENZOCAINE-MENTHOL 20-0.5 % EX AERO
1.0000 | INHALATION_SPRAY | CUTANEOUS | Status: DC | PRN
Start: 2015-08-26 — End: 2015-08-28
  Filled 2015-08-26: qty 56

## 2015-08-26 MED ORDER — SIMETHICONE 80 MG PO CHEW
80.0000 mg | CHEWABLE_TABLET | ORAL | Status: DC | PRN
  Administered 2015-08-28: 80 mg via ORAL
  Filled 2015-08-26: qty 1

## 2015-08-26 MED ORDER — SENNOSIDES-DOCUSATE SODIUM 8.6-50 MG PO TABS
2.0000 | ORAL_TABLET | ORAL | Status: DC
  Administered 2015-08-27 – 2015-08-28 (×2): 2 via ORAL
  Filled 2015-08-26 (×2): qty 2

## 2015-08-26 MED ORDER — METHYLERGONOVINE MALEATE 0.2 MG/ML IJ SOLN: 0.2000 mg | INTRAMUSCULAR | Status: DC | PRN

## 2015-08-26 MED ORDER — OXYCODONE-ACETAMINOPHEN 5-325 MG PO TABS
1.0000 | ORAL_TABLET | ORAL | Status: DC | PRN
  Administered 2015-08-27 – 2015-08-28 (×2): 1 via ORAL
  Filled 2015-08-26 (×2): qty 1

## 2015-08-26 MED ORDER — LIDOCAINE HCL (PF) 1 % IJ SOLN
INTRAMUSCULAR | Status: DC | PRN
  Administered 2015-08-26: 4 mL
  Administered 2015-08-26: 6 mL via EPIDURAL

## 2015-08-26 MED ORDER — LACTATED RINGERS IV SOLN
500.0000 mL | Freq: Once | INTRAVENOUS | Status: AC
  Administered 2015-08-26: 1000 mL via INTRAVENOUS

## 2015-08-26 NOTE — Anesthesia Preprocedure Evaluation (Signed)

## 2015-08-26 NOTE — Anesthesia Procedure Notes (Signed)
Epidural Patient location during procedure: OB  Preanesthetic Checklist Completed: patient identified, site marked, surgical consent, pre-op evaluation, timeout performed, IV checked, risks and benefits discussed and monitors and equipment checked  Epidural Patient position: sitting Prep: site prepped and draped and DuraPrep Patient monitoring: continuous pulse ox and blood pressure Approach: midline Injection technique: LOR air  Needle:  Needle type: Tuohy  Needle gauge: 17 G Needle length: 9 cm and 9 Needle insertion depth: 4 cm Catheter type: closed end flexible Catheter size: 19 Gauge Catheter at skin depth: 10 cm Test dose: negative  Assessment Events: blood not aspirated, injection not painful, no injection resistance, negative IV test and no paresthesia  Additional Notes Dosing of Epidural:  1st dose, through catheter .............................................  Xylocaine 40 mg  2nd dose, through catheter, after waiting 3 minutes.........Xylocaine 60 mg    As each dose occurred, patient was free of IV sx; and patient exhibited no evidence of SA injection.  Patient is more comfortable after epidural dosed. Please see RN's note for documentation of vital signs,and FHR which are stable.  Patient reminded not to try to ambulate with numb legs, and that an RN must be present when she attempts to get up.       

## 2015-08-26 NOTE — H&P (Signed)
40 y.o. 978w6d  G2P1001 comes in for induction at term for AMA with favorable cervix.  Otherwise has good fetal movement and no bleeding.  Past Medical History  Diagnosis Date  . Medical history non-contributory     Past Surgical History  Procedure Laterality Date  . Other surgical history      "growth" on shoulder  . Shoulder surgery Right     OB History  Gravida Para Term Preterm AB SAB TAB Ectopic Multiple Living  2 1 1       0 1    # Outcome Date GA Lbr Len/2nd Weight Sex Delivery Anes PTL Lv  2 Current           1 Term 05/23/14 436w3d 07:29 / 01:12 2.855 kg (6 lb 4.7 oz) F Vag-Spont EPI  Y      Social History   Social History  . Marital Status: Married    Spouse Name: N/A  . Number of Children: N/A  . Years of Education: N/A   Occupational History  . Not on file.   Social History Main Topics  . Smoking status: Never Smoker   . Smokeless tobacco: Not on file  . Alcohol Use: No  . Drug Use: No  . Sexual Activity: Yes   Other Topics Concern  . Not on file   Social History Narrative   Review of patient's allergies indicates no known allergies.    Prenatal Transfer Tool  Maternal Diabetes: No Genetic Screening: Normal Maternal Ultrasounds/Referrals: Normal Fetal Ultrasounds or other Referrals:  None Maternal Substance Abuse:  No Significant Maternal Medications:  None Significant Maternal Lab Results: None  Other PNC: uncomplicated.    There were no vitals filed for this visit.   Lungs/Cor:  NAD Abdomen:  soft, gravid Ex:  no cords, erythema SVE:  3/70/-1 FHTs:  140, good STV, NST R Toco:  q occ   A/P   Term induction AMA with favorable cervix.  GBS pos.- PCN.  Kristen Sheppard A

## 2015-08-27 ENCOUNTER — Inpatient Hospital Stay (HOSPITAL_COMMUNITY): Payer: Commercial Managed Care - PPO | Admitting: Anesthesiology

## 2015-08-27 ENCOUNTER — Encounter (HOSPITAL_COMMUNITY)
Admission: AD | Disposition: A | Discharge status: Home / Self Care | Payer: Self-pay | Source: Ambulatory Visit | Attending: Obstetrics and Gynecology

## 2015-08-27 HISTORY — PX: TUBAL LIGATION: SHX77

## 2015-08-27 LAB — CBC
HEMATOCRIT: 32.1 % — AB (ref 36.0–46.0)
Hemoglobin: 10.9 g/dL — ABNORMAL LOW (ref 12.0–15.0)
MCH: 28.5 pg (ref 26.0–34.0)
MCHC: 34 g/dL (ref 30.0–36.0)
MCV: 83.8 fL (ref 78.0–100.0)
PLATELETS: 232 10*3/uL (ref 150–400)
RBC: 3.83 MIL/uL — ABNORMAL LOW (ref 3.87–5.11)
RDW: 14 % (ref 11.5–15.5)
WBC: 20.3 10*3/uL — AB (ref 4.0–10.5)

## 2015-08-27 SURGERY — LIGATION, FALLOPIAN TUBE, POSTPARTUM
Anesthesia: Epidural | Laterality: Bilateral

## 2015-08-27 MED ORDER — PHENYLEPHRINE 40 MCG/ML (10ML) SYRINGE FOR IV PUSH (FOR BLOOD PRESSURE SUPPORT)
PREFILLED_SYRINGE | INTRAVENOUS | Status: AC
  Filled 2015-08-27: qty 10

## 2015-08-27 MED ORDER — ONDANSETRON HCL 4 MG/2ML IJ SOLN
INTRAMUSCULAR | Status: DC | PRN
  Administered 2015-08-27: 4 mg via INTRAVENOUS

## 2015-08-27 MED ORDER — FAMOTIDINE 20 MG PO TABS
40.0000 mg | ORAL_TABLET | Freq: Once | ORAL | Status: AC
  Administered 2015-08-27: 40 mg via ORAL
  Filled 2015-08-27: qty 2

## 2015-08-27 MED ORDER — BUPIVACAINE HCL (PF) 0.25 % IJ SOLN
INTRAMUSCULAR | Status: AC
  Filled 2015-08-27: qty 30

## 2015-08-27 MED ORDER — DEXAMETHASONE SODIUM PHOSPHATE 4 MG/ML IJ SOLN
INTRAMUSCULAR | Status: AC
  Filled 2015-08-27: qty 1

## 2015-08-27 MED ORDER — KETOROLAC TROMETHAMINE 30 MG/ML IJ SOLN
INTRAMUSCULAR | Status: AC
  Filled 2015-08-27: qty 1

## 2015-08-27 MED ORDER — FENTANYL CITRATE (PF) 100 MCG/2ML IJ SOLN
INTRAMUSCULAR | Status: AC
  Filled 2015-08-27: qty 2

## 2015-08-27 MED ORDER — DEXAMETHASONE SODIUM PHOSPHATE 4 MG/ML IJ SOLN
INTRAMUSCULAR | Status: DC | PRN
  Administered 2015-08-27: 4 mg via INTRAVENOUS

## 2015-08-27 MED ORDER — LACTATED RINGERS IV SOLN
INTRAVENOUS | Status: DC | PRN
  Administered 2015-08-27 (×2): via INTRAVENOUS

## 2015-08-27 MED ORDER — FENTANYL CITRATE (PF) 100 MCG/2ML IJ SOLN
INTRAMUSCULAR | Status: DC | PRN
  Administered 2015-08-27 (×2): 50 ug via INTRAVENOUS

## 2015-08-27 MED ORDER — METOCLOPRAMIDE HCL 10 MG PO TABS
10.0000 mg | ORAL_TABLET | Freq: Once | ORAL | Status: AC
  Administered 2015-08-27: 10 mg via ORAL
  Filled 2015-08-27: qty 1

## 2015-08-27 MED ORDER — LACTATED RINGERS IV SOLN
INTRAVENOUS | Status: DC
  Administered 2015-08-27: 125 mL/h via INTRAVENOUS
  Administered 2015-08-27: 10:00:00 via INTRAVENOUS

## 2015-08-27 MED ORDER — ONDANSETRON HCL 4 MG/2ML IJ SOLN
INTRAMUSCULAR | Status: AC
  Filled 2015-08-27: qty 2

## 2015-08-27 MED ORDER — MIDAZOLAM HCL 5 MG/5ML IJ SOLN
INTRAMUSCULAR | Status: DC | PRN
  Administered 2015-08-27: 2 mg via INTRAVENOUS

## 2015-08-27 MED ORDER — SODIUM BICARBONATE 8.4 % IV SOLN
INTRAVENOUS | Status: DC | PRN
  Administered 2015-08-27 (×3): 5 mL via EPIDURAL

## 2015-08-27 MED ORDER — MEPERIDINE HCL 25 MG/ML IJ SOLN
INTRAMUSCULAR | Status: AC
  Filled 2015-08-27: qty 1

## 2015-08-27 MED ORDER — SODIUM BICARBONATE 8.4 % IV SOLN
INTRAVENOUS | Status: AC
  Filled 2015-08-27: qty 50

## 2015-08-27 MED ORDER — MEPERIDINE HCL 25 MG/ML IJ SOLN
INTRAMUSCULAR | Status: DC | PRN
  Administered 2015-08-27: 12.5 mg via INTRAVENOUS

## 2015-08-27 MED ORDER — KETOROLAC TROMETHAMINE 30 MG/ML IJ SOLN
INTRAMUSCULAR | Status: DC | PRN
  Administered 2015-08-27: 30 mg via INTRAVENOUS

## 2015-08-27 MED ORDER — LIDOCAINE-EPINEPHRINE (PF) 2 %-1:200000 IJ SOLN
INTRAMUSCULAR | Status: AC
  Filled 2015-08-27: qty 20

## 2015-08-27 MED ORDER — PHENYLEPHRINE HCL 10 MG/ML IJ SOLN
INTRAMUSCULAR | Status: DC | PRN
  Administered 2015-08-27: 40 ug via INTRAVENOUS

## 2015-08-27 MED ORDER — BUPIVACAINE HCL (PF) 0.25 % IJ SOLN
INTRAMUSCULAR | Status: DC | PRN
  Administered 2015-08-27: 1 mL

## 2015-08-27 MED ORDER — MIDAZOLAM HCL 2 MG/2ML IJ SOLN
INTRAMUSCULAR | Status: AC
  Filled 2015-08-27: qty 2

## 2015-08-27 SURGICAL SUPPLY — 27 items
CHLORAPREP W/TINT 26ML (MISCELLANEOUS) ×2 IMPLANT
CLIP FILSHIE TUBAL LIGA STRL (Clip) ×2 IMPLANT
CLOTH BEACON ORANGE TIMEOUT ST (SAFETY) ×2 IMPLANT
CONTAINER PREFILL 10% NBF 15ML (MISCELLANEOUS) ×4 IMPLANT
DRSG OPSITE POSTOP 3X4 (GAUZE/BANDAGES/DRESSINGS) ×2 IMPLANT
ELECT REM PT RETURN 9FT ADLT (ELECTROSURGICAL) ×2
ELECTRODE REM PT RTRN 9FT ADLT (ELECTROSURGICAL) ×1 IMPLANT
GLOVE BIO SURGEON STRL SZ7 (GLOVE) ×2 IMPLANT
GLOVE BIOGEL PI IND STRL 6.5 (GLOVE) ×1 IMPLANT
GLOVE BIOGEL PI IND STRL 7.0 (GLOVE) ×1 IMPLANT
GLOVE BIOGEL PI INDICATOR 6.5 (GLOVE) ×1
GLOVE BIOGEL PI INDICATOR 7.0 (GLOVE) ×1
GLOVE ECLIPSE 6.5 STRL STRAW (GLOVE) ×2 IMPLANT
GOWN STRL REUS W/TWL LRG LVL3 (GOWN DISPOSABLE) ×4 IMPLANT
LIQUID BAND (GAUZE/BANDAGES/DRESSINGS) ×2 IMPLANT
NEEDLE HYPO 22GX1.5 SAFETY (NEEDLE) IMPLANT
NS IRRIG 1000ML POUR BTL (IV SOLUTION) ×2 IMPLANT
PACK ABDOMINAL MINOR (CUSTOM PROCEDURE TRAY) ×2 IMPLANT
PENCIL BUTTON HOLSTER BLD 10FT (ELECTRODE) ×2 IMPLANT
SPONGE LAP 4X18 X RAY DECT (DISPOSABLE) ×2 IMPLANT
SUT PLAIN 0 NONE (SUTURE) ×2 IMPLANT
SUT VIC AB 2-0 UR5 27 (SUTURE) ×2 IMPLANT
SUT VICRYL 4-0 PS2 18IN ABS (SUTURE) ×2 IMPLANT
SYR CONTROL 10ML LL (SYRINGE) IMPLANT
TOWEL OR 17X24 6PK STRL BLUE (TOWEL DISPOSABLE) ×4 IMPLANT
TRAY FOLEY CATH SILVER 14FR (SET/KITS/TRAYS/PACK) ×2 IMPLANT
WATER STERILE IRR 1000ML POUR (IV SOLUTION) ×2 IMPLANT

## 2015-08-27 NOTE — Progress Notes (Signed)
Patient is eating, ambulating, voiding.  Pain control is good.  Appropriate lochia, no compaints.  Desires postpartum tubal today for permanent sterilization.  Filed Vitals:   08/26/15 2251 08/27/15 0000 08/27/15 0400 08/27/15 0959  BP: 102/63 115/64 100/65 108/70  Pulse: 82 75 76 70  Temp: 97.9 F (36.6 C) 98.2 F (36.8 C) 98 F (36.7 C) 97.9 F (36.6 C)  TempSrc: Oral Oral Oral   Resp: 18 18 18 20   Height:      Weight:      SpO2:    98%    Fundus firm Perineum without swelling. No CT  Lab Results  Component Value Date   WBC 20.3* 08/27/2015   HGB 10.9* 08/27/2015   HCT 32.1* 08/27/2015   MCV 83.8 08/27/2015   PLT 232 08/27/2015    --/--/A POS (03/21 0810)  A/P Post partum day 1 NPO since midnight. Reviewed risks, benefits, alternative, complications and alternatives to procedure.  Routine care.  Expect d/c 3/23.    Philip AspenALLAHAN, Hanzel Pizzo

## 2015-08-27 NOTE — Transfer of Care (Signed)
Immediate Anesthesia Transfer of Care Note  Patient: Kristen Sheppard  Procedure(s) Performed: Procedure(s): POST PARTUM TUBAL LIGATION (Bilateral)  Patient Location: PACU  Anesthesia Type:General  Level of Consciousness: awake, alert  and oriented  Airway & Oxygen Therapy: Patient Spontanous Breathing and Patient connected to nasal cannula oxygen  Post-op Assessment: Report given to RN and Post -op Vital signs reviewed and stable  Post vital signs: Reviewed and stable  Last Vitals:  Filed Vitals:   08/27/15 0400 08/27/15 0959  BP: 100/65 108/70  Pulse: 76 70  Temp: 36.7 C 36.6 C  Resp: 18 20    Complications: No apparent anesthesia complications

## 2015-08-27 NOTE — Brief Op Note (Signed)
08/26/2015 - 08/27/2015  1:07 PM  PATIENT:  Kristen Sheppard  40 y.o. female  PRE-OPERATIVE DIAGNOSIS:  desires sterilization  POST-OPERATIVE DIAGNOSIS:  desires sterilization  PROCEDURE:  Procedure(s): POST PARTUM TUBAL LIGATION (Bilateral)  SURGEON:  Surgeon(s) and Role:    Philip Aspen* Jmichael Gille, DO - Primary  ANESTHESIA:   epidural and IV sedation  EBL:  Total I/O In: 1500 [I.V.:1500] Out: 155 [Urine:150; Blood:5]  LOCAL MEDICATIONS USED:  MARCAINE    and Amount: 1 ml  COUNTS:  YES  PLAN OF CARE: Admit to inpatient   PATIENT DISPOSITION:  PACU - hemodynamically stable.   Delay start of Pharmacological VTE agent (>24hrs) due to surgical blood loss or risk of bleeding: not applicable

## 2015-08-27 NOTE — Anesthesia Postprocedure Evaluation (Signed)
Anesthesia Post Note  Patient: Kristen Sheppard  Procedure(s) Performed: Procedure(s) (LRB): POST PARTUM TUBAL LIGATION (Bilateral)  Patient location during evaluation: PACU Anesthesia Type: Epidural Level of consciousness: awake and alert Pain management: pain level controlled Vital Signs Assessment: post-procedure vital signs reviewed and stable Respiratory status: spontaneous breathing, nonlabored ventilation, respiratory function stable and patient connected to nasal cannula oxygen Cardiovascular status: blood pressure returned to baseline and stable Postop Assessment: no signs of nausea or vomiting and epidural receding Anesthetic complications: no    Last Vitals:  Filed Vitals:   08/27/15 1426 08/27/15 1438  BP: 120/70 127/72  Pulse:  77  Temp:  36.6 C  Resp:  16    Last Pain:  Filed Vitals:   08/27/15 1450  PainSc: 3                  Shakeema Lippman JENNETTE

## 2015-08-27 NOTE — Anesthesia Postprocedure Evaluation (Signed)
Anesthesia Post Note  Patient: Kristen Sheppard  Procedure(s) Performed: * No procedures listed *  Patient location during evaluation: Mother Baby Anesthesia Type: Epidural Level of consciousness: awake and alert Pain management: pain level controlled Vital Signs Assessment: post-procedure vital signs reviewed and stable Respiratory status: spontaneous breathing, nonlabored ventilation and respiratory function stable Cardiovascular status: stable Postop Assessment: no headache, no backache and epidural receding Anesthetic complications: no    Last Vitals:  Filed Vitals:   08/27/15 0000 08/27/15 0400  BP: 115/64 100/65  Pulse: 75 76  Temp: 36.8 C 36.7 C  Resp: 18 18    Last Pain:  Filed Vitals:   08/27/15 0625  PainSc: 0-No pain                 Annalisse Minkoff

## 2015-08-27 NOTE — Anesthesia Preprocedure Evaluation (Signed)
Anesthesia Evaluation  Patient identified by MRN, date of birth, ID band Patient awake    Reviewed: Allergy & Precautions, NPO status , Patient's Chart, lab work & pertinent test results  History of Anesthesia Complications Negative for: history of anesthetic complications  Airway Mallampati: II  TM Distance: >3 FB Neck ROM: Full    Dental no notable dental hx. (+) Dental Advisory Given   Pulmonary neg pulmonary ROS,    Pulmonary exam normal breath sounds clear to auscultation       Cardiovascular negative cardio ROS Normal cardiovascular exam Rhythm:Regular Rate:Normal     Neuro/Psych negative neurological ROS  negative psych ROS   GI/Hepatic negative GI ROS, Neg liver ROS,   Endo/Other  negative endocrine ROS  Renal/GU negative Renal ROS  negative genitourinary   Musculoskeletal negative musculoskeletal ROS (+)   Abdominal   Peds negative pediatric ROS (+)  Hematology negative hematology ROS (+)   Anesthesia Other Findings   Reproductive/Obstetrics Postpartum                             Anesthesia Physical Anesthesia Plan  ASA: II  Anesthesia Plan: Epidural   Post-op Pain Management:    Induction:   Airway Management Planned:   Additional Equipment:   Intra-op Plan:   Post-operative Plan:   Informed Consent: I have reviewed the patients History and Physical, chart, labs and discussed the procedure including the risks, benefits and alternatives for the proposed anesthesia with the patient or authorized representative who has indicated his/her understanding and acceptance.   Dental advisory given  Plan Discussed with: CRNA  Anesthesia Plan Comments:         Anesthesia Quick Evaluation

## 2015-08-27 NOTE — Op Note (Signed)
NAME:  Ost, Jun     ACCOUNT NO.:  [id number] Mourning863  MEDICAL RECORD NO.:  [id number]  LOCATION:  9108                          FACILITY:  [facility]  PHYSICIAN:  Philip AspenSidney Alson Mcpheeters, DO    DATE OF BIRTH:  [birth year]  DATE OF PROCEDURE:  08/27/2015 DATE OF DISCHARGE:                              OPERATIVE REPORT   PREOPERATIVE DIAGNOSIS:  Desired permanent sterilization.  POSTOPERATIVE DIAGNOSIS:  Desired permanent sterilization.  PROCEDURE:  Postpartum tubal ligation, bilateral.  SURGEON:  Philip AspenSidney Adrielle Polakowski, DO  ANESTHESIA:  Epidural with IV sedation.  IV FLUIDS:  See brief op note  URINE OUTPUT:  See brief op note  ESTIMATED BLOOD LOSS:  5 mL.  LOCAL MEDICATION:  1 mL of 0.25% Marcaine.  COMPLICATIONS:  None.  CONDITION:  Stable to PACU.  DESCRIPTION OF PROCEDURE:  Patient was taken to the operating room, where anesthesia was administered and found to be adequate.  She was prepped and draped in the normal sterile fashion in dorsal supine position.  The infraumbilical fold was grasped with Allis clamps and injected with 1 mL of Marcaine.  A scalpel was used to make a horizontal incision.  The subcutaneous tissue was grasped with hemostat and dissected with Metzenbaum scissors.  The peritoneum was identified and entered sharply.  The sutures were placed at both ends of the incision. Patient was tilted to her right and left fallopian tube was identified and followed to its fimbriated end.  It was elevated with Babcock clamps and a Filshie clip was placed surrounding the entire tube.  The same procedure was performed for the right tube.  This procedure was completed without difficulty.  The fascia was elevated with stay sutures and closed with a running 1 Vicryl suture.  Subcuticular tissue was closed with 4-0 Monocryl.  Patient tolerated the procedure well.  Sponge, lap, and needle counts were correct x2.  Patient was taken to recovery in stable condition.     ______________________________ Philip AspenSidney Enzley Kitchens, DO     [place]/MEDQ  D:  08/27/2015  T:  08/27/2015  Job:  [id number]

## 2015-08-27 NOTE — Lactation Note (Signed)
This note was copied from a baby's chart. Lactation Consultation Note  P2, BF daughter for approx 6 weeks stated she had low milk supply. Discussed supply and demand, the importance of establishing milk supply and stomach size. Baby latched in cradle.  Helped mother change to cross cradle for more depth and to allow her to massage breast during feeding. A few swallows observed.  Mother states she can hand express and has viewed colostrum. Noted formula on counter.  Encouraged breastfeeding before offering formula. Mom encouraged to feed baby 8-12 times/24 hours and with feeding cues. Discussed cluster feeding. Mom made aware of O/P services, breastfeeding support groups, community resources, and our phone # for post-discharge questions.    Patient Name: Boy Maryagnes AmosBounpasert Arnold WUJWJ'XToday's Date: 08/27/2015 Reason for consult: Follow-up assessment   Maternal Data    Feeding Feeding Type: Breast Fed  LATCH Score/Interventions Latch: Grasps breast easily, tongue down, lips flanged, rhythmical sucking. Intervention(s): Breast massage  Audible Swallowing: A few with stimulation  Type of Nipple: Everted at rest and after stimulation  Comfort (Breast/Nipple): Soft / non-tender     Hold (Positioning): Assistance needed to correctly position infant at breast and maintain latch.  LATCH Score: 8  Lactation Tools Discussed/Used     Consult Status Consult Status: Follow-up Date: 08/28/15 Follow-up type: In-patient    Dahlia ByesBerkelhammer, Ruth [facility] 08/27/2015, 3:11 PM

## 2015-08-28 ENCOUNTER — Encounter (HOSPITAL_COMMUNITY): Payer: Self-pay | Admitting: Obstetrics and Gynecology

## 2015-08-28 MED ORDER — OXYCODONE-ACETAMINOPHEN 5-325 MG PO TABS: 1.0000 | ORAL_TABLET | ORAL | Status: AC | PRN

## 2015-08-28 NOTE — Anesthesia Postprocedure Evaluation (Signed)
Anesthesia Post Note  Patient: Kristen Sheppard  Procedure(s) Performed: Procedure(s) (LRB): POST PARTUM TUBAL LIGATION (Bilateral)  Patient location during evaluation: Mother Baby Anesthesia Type: Epidural Level of consciousness: awake and alert Pain management: pain level controlled Vital Signs Assessment: post-procedure vital signs reviewed and stable Respiratory status: spontaneous breathing Cardiovascular status: stable Postop Assessment: no headache, adequate PO intake, no backache, patient able to bend at knees, epidural receding and no signs of nausea or vomiting Anesthetic complications: no    Last Vitals:  Filed Vitals:   08/28/15 0055 08/28/15 0500  BP: 108/70 96/60  Pulse: 72 72  Temp: 36.8 C 36.8 C  Resp: 18 18    Last Pain:  Filed Vitals:   08/28/15 0520  PainSc: 2                  Kristen Sheppard, Kristen Sheppard

## 2015-08-28 NOTE — Discharge Summary (Addendum)
Obstetric Discharge Summary Reason for Admission: induction of labor Prenatal Procedures: NST and ultrasound Intrapartum Procedures: spontaneous vaginal delivery and tubal ligation and repair of 1st degree perineal tear Postpartum Procedures: none Complications-Operative and Postpartum: none HEMOGLOBIN  Date Value Ref Range Status  08/27/2015 10.9* 12.0 - 15.0 g/dL Final   HCT  Date Value Ref Range Status  08/27/2015 32.1* 36.0 - 46.0 % Final    Physical Exam:  General: alert Lochia: appropriate Uterine Fundus: fir  Discharge Diagnoses: Term Pregnancy-delivered and AMA  Discharge Information: Date: 08/28/2015 Activity: pelvic rest Diet: routine Medications: PNV, Ibuprofen and Percocet Condition: stable Instructions: refer to practice specific booklet Discharge to: home Follow-up Information    Follow up with HORVATH,MICHELLE A, MD. Schedule an appointment as soon as possible for a visit in 1 month.   Specialty:  Obstetrics and Gynecology   Contact information:   [address] [place] [address] [phone]       Newborn Data: Live born female  Birth Weight: 7 lb 1.4 oz (3215 g) APGAR: 9, 9  Home with mother.  ANDERSON,MARK E 08/28/2015, 9:13 AM

## 2015-08-28 NOTE — Lactation Note (Signed)
This note was copied from a baby's chart. Lactation Consultation Note  Patient Name: Kristen Maryagnes AmosBounpasert Kuehnle WUJWJ'XToday's Date: 08/28/2015 Reason for consult: Follow-up assessment;Other (Comment) (5% weight loss, At 33 hours Bili 8.6, Low/ intermediate )  Baby has been consistent at the breast, and at the start of the consult baby was latched in a sluggish feeding with swallows.  Per mom the baby has been feeding for 13 mins. Released at 15 mins.  Sore nipples and engorgement prevention and tx reviewed. Per mom with her 1st baby , baby was loosing weight and baby had be supplemented  And mom milk never really came in. [facility] the weight loss today is WNL . Breast feeding 8-10 x's day and not to go over 3 hpurs without feeding.  Also skin to skin feedings until the baby can stay awake for a feeding.  LC recommended coming back for LC O/P appt next week and mom declined making the appt. Today and will plan to call back. LC stressed the imp[ortance due to her hx of low milk supply with her 1st baby to obtain a check on milk supply.  LC referred mom to Baby  and me booklet for resource. Mother informed of post-discharge support and given phone number to the lactation department, including services for phone call assistance; out-patient appointments; and breastfeeding support group. List of other breastfeeding resources in the community given in the handout. Encouraged mother to call for problems or concerns related to breastfeeding.  Maternal Data Has patient been taught Hand Expression?: Yes  Feeding Feeding Type:  (baby jsut finishing up feeding ) Length of feed: 15 min (baby latched, swallows noted, sluggish feeding pattern )  LATCH Score/Interventions                Intervention(s): Breastfeeding basics reviewed     Lactation Tools Discussed/Used     Consult Status Consult Status: Complete Date: 08/28/15    Kristen Sheppard, Kristen Sheppard 08/28/2015, 12:18 PM

## 2015-08-28 NOTE — Addendum Note (Signed)
Addendum  created 08/28/15 0600 by Armanda Heritageharlesetta M Lexxi Koslow, CRNA   Modules edited: Clinical Notes   Clinical Notes:  File: [id number]

## 2015-08-28 NOTE — Progress Notes (Signed)
PPD#2 POD#1 Pt without complaints.Ready for discharge.

## 2015-12-15 ENCOUNTER — Other Ambulatory Visit: Payer: Self-pay | Admitting: Family Medicine

## 2015-12-15 DIAGNOSIS — Z139 Encounter for screening, unspecified: Secondary | ICD-10-CM

## 2015-12-25 ENCOUNTER — Ambulatory Visit
Admission: RE | Admit: 2015-12-25 | Discharge: 2015-12-25 | Disposition: A | Discharge status: Home / Self Care | Payer: Commercial Managed Care - PPO | Source: Ambulatory Visit | Attending: Family Medicine | Admitting: Family Medicine

## 2015-12-25 DIAGNOSIS — Z139 Encounter for screening, unspecified: Secondary | ICD-10-CM

## 2016-06-29 DIAGNOSIS — J069 Acute upper respiratory infection, unspecified: Secondary | ICD-10-CM | POA: Diagnosis not present

## 2016-06-29 DIAGNOSIS — M533 Sacrococcygeal disorders, not elsewhere classified: Secondary | ICD-10-CM | POA: Diagnosis not present

## 2016-06-29 DIAGNOSIS — L299 Pruritus, unspecified: Secondary | ICD-10-CM | POA: Diagnosis not present

## 2016-09-06 ENCOUNTER — Ambulatory Visit
Admission: RE | Admit: 2016-09-06 | Discharge: 2016-09-06 | Disposition: A | Discharge status: Home / Self Care | Payer: Commercial Managed Care - PPO | Source: Ambulatory Visit | Attending: Family Medicine | Admitting: Family Medicine

## 2016-09-06 ENCOUNTER — Other Ambulatory Visit: Payer: Self-pay | Admitting: Family Medicine

## 2016-09-06 DIAGNOSIS — W108XXA Fall (on) (from) other stairs and steps, initial encounter: Secondary | ICD-10-CM

## 2016-09-06 DIAGNOSIS — M79671 Pain in right foot: Secondary | ICD-10-CM | POA: Diagnosis not present

## 2016-09-06 DIAGNOSIS — S99921A Unspecified injury of right foot, initial encounter: Secondary | ICD-10-CM | POA: Diagnosis not present

## 2016-09-06 DIAGNOSIS — S9030XA Contusion of unspecified foot, initial encounter: Secondary | ICD-10-CM | POA: Diagnosis not present

## 2016-11-22 ENCOUNTER — Other Ambulatory Visit: Payer: Self-pay | Admitting: Family Medicine

## 2016-11-22 DIAGNOSIS — Z1231 Encounter for screening mammogram for malignant neoplasm of breast: Secondary | ICD-10-CM

## 2016-11-30 DIAGNOSIS — R000 Tachycardia, unspecified: Secondary | ICD-10-CM | POA: Diagnosis not present

## 2016-11-30 DIAGNOSIS — Z13220 Encounter for screening for lipoid disorders: Secondary | ICD-10-CM | POA: Diagnosis not present

## 2016-11-30 DIAGNOSIS — Z0000 Encounter for general adult medical examination without abnormal findings: Secondary | ICD-10-CM | POA: Diagnosis not present

## 2016-11-30 DIAGNOSIS — Z131 Encounter for screening for diabetes mellitus: Secondary | ICD-10-CM | POA: Diagnosis not present

## 2016-12-14 ENCOUNTER — Ambulatory Visit
Admission: RE | Admit: 2016-12-14 | Discharge: 2016-12-14 | Disposition: A | Discharge status: Home / Self Care | Payer: Commercial Managed Care - PPO | Source: Ambulatory Visit | Attending: Family Medicine | Admitting: Family Medicine

## 2016-12-14 DIAGNOSIS — Z1231 Encounter for screening mammogram for malignant neoplasm of breast: Secondary | ICD-10-CM

## 2017-03-09 DIAGNOSIS — Z23 Encounter for immunization: Secondary | ICD-10-CM | POA: Diagnosis not present

## 2017-05-05 ENCOUNTER — Other Ambulatory Visit: Payer: Self-pay | Admitting: Family Medicine

## 2017-05-05 DIAGNOSIS — N946 Dysmenorrhea, unspecified: Secondary | ICD-10-CM

## 2017-05-16 ENCOUNTER — Other Ambulatory Visit: Payer: Commercial Managed Care - PPO

## 2017-05-18 ENCOUNTER — Ambulatory Visit
Admission: RE | Admit: 2017-05-18 | Discharge: 2017-05-18 | Disposition: A | Discharge status: Home / Self Care | Payer: Commercial Managed Care - PPO | Source: Ambulatory Visit | Attending: Family Medicine | Admitting: Family Medicine

## 2017-05-18 DIAGNOSIS — N946 Dysmenorrhea, unspecified: Secondary | ICD-10-CM | POA: Diagnosis not present

## 2017-11-07 ENCOUNTER — Other Ambulatory Visit: Payer: Self-pay | Admitting: Family Medicine

## 2017-11-07 DIAGNOSIS — Z1231 Encounter for screening mammogram for malignant neoplasm of breast: Secondary | ICD-10-CM

## 2017-12-01 ENCOUNTER — Other Ambulatory Visit: Payer: Self-pay | Admitting: Family Medicine

## 2017-12-01 ENCOUNTER — Other Ambulatory Visit (HOSPITAL_COMMUNITY)
Admission: RE | Admit: 2017-12-01 | Discharge: 2017-12-01 | Disposition: A | Discharge status: Home / Self Care | Payer: Commercial Managed Care - PPO | Source: Ambulatory Visit | Attending: Family Medicine | Admitting: Family Medicine

## 2017-12-01 DIAGNOSIS — R102 Pelvic and perineal pain: Secondary | ICD-10-CM

## 2017-12-01 DIAGNOSIS — Z01419 Encounter for gynecological examination (general) (routine) without abnormal findings: Secondary | ICD-10-CM | POA: Insufficient documentation

## 2017-12-01 DIAGNOSIS — Z131 Encounter for screening for diabetes mellitus: Secondary | ICD-10-CM | POA: Diagnosis not present

## 2017-12-01 DIAGNOSIS — Z0000 Encounter for general adult medical examination without abnormal findings: Secondary | ICD-10-CM | POA: Diagnosis not present

## 2017-12-01 DIAGNOSIS — Z136 Encounter for screening for cardiovascular disorders: Secondary | ICD-10-CM | POA: Diagnosis not present

## 2017-12-01 DIAGNOSIS — Z124 Encounter for screening for malignant neoplasm of cervix: Secondary | ICD-10-CM | POA: Diagnosis not present

## 2017-12-05 LAB — CYTOLOGY - PAP: DIAGNOSIS: NEGATIVE

## 2017-12-09 ENCOUNTER — Ambulatory Visit
Admission: RE | Admit: 2017-12-09 | Discharge: 2017-12-09 | Disposition: A | Discharge status: Home / Self Care | Payer: Commercial Managed Care - PPO | Source: Ambulatory Visit | Attending: Family Medicine | Admitting: Family Medicine

## 2017-12-09 DIAGNOSIS — N950 Postmenopausal bleeding: Secondary | ICD-10-CM | POA: Diagnosis not present

## 2017-12-09 DIAGNOSIS — R102 Pelvic and perineal pain: Secondary | ICD-10-CM

## 2017-12-16 ENCOUNTER — Ambulatory Visit
Admission: RE | Admit: 2017-12-16 | Discharge: 2017-12-16 | Disposition: A | Discharge status: Home / Self Care | Payer: Commercial Managed Care - PPO | Source: Ambulatory Visit | Attending: Family Medicine | Admitting: Family Medicine

## 2017-12-16 DIAGNOSIS — Z1231 Encounter for screening mammogram for malignant neoplasm of breast: Secondary | ICD-10-CM | POA: Diagnosis not present

## 2018-11-08 ENCOUNTER — Other Ambulatory Visit: Payer: Self-pay | Admitting: Family Medicine

## 2018-11-08 DIAGNOSIS — Z1231 Encounter for screening mammogram for malignant neoplasm of breast: Secondary | ICD-10-CM

## 2018-12-29 ENCOUNTER — Ambulatory Visit: Payer: Commercial Managed Care - PPO

## 2019-01-22 ENCOUNTER — Ambulatory Visit
Admission: RE | Admit: 2019-01-22 | Discharge: 2019-01-22 | Disposition: A | Discharge status: Home / Self Care | Payer: Commercial Managed Care - PPO | Source: Ambulatory Visit | Attending: Family Medicine | Admitting: Family Medicine

## 2019-01-22 ENCOUNTER — Other Ambulatory Visit: Payer: Self-pay

## 2019-01-22 DIAGNOSIS — Z1231 Encounter for screening mammogram for malignant neoplasm of breast: Secondary | ICD-10-CM

## 2019-12-21 ENCOUNTER — Other Ambulatory Visit: Payer: Self-pay | Admitting: Family Medicine

## 2019-12-21 DIAGNOSIS — Z1231 Encounter for screening mammogram for malignant neoplasm of breast: Secondary | ICD-10-CM

## 2020-01-23 ENCOUNTER — Ambulatory Visit
Admission: RE | Admit: 2020-01-23 | Discharge: 2020-01-23 | Disposition: A | Discharge status: Home / Self Care | Payer: Commercial Managed Care - PPO | Source: Ambulatory Visit | Attending: Family Medicine | Admitting: Family Medicine

## 2020-01-23 ENCOUNTER — Other Ambulatory Visit: Payer: Self-pay

## 2020-01-23 DIAGNOSIS — Z1231 Encounter for screening mammogram for malignant neoplasm of breast: Secondary | ICD-10-CM

## 2020-01-25 ENCOUNTER — Other Ambulatory Visit: Payer: Self-pay | Admitting: Family Medicine

## 2020-01-25 DIAGNOSIS — R928 Other abnormal and inconclusive findings on diagnostic imaging of breast: Secondary | ICD-10-CM

## 2020-02-01 ENCOUNTER — Ambulatory Visit
Admission: RE | Admit: 2020-02-01 | Discharge: 2020-02-01 | Disposition: A | Discharge status: Home / Self Care | Payer: Commercial Managed Care - PPO | Source: Ambulatory Visit | Attending: Family Medicine | Admitting: Family Medicine

## 2020-02-01 ENCOUNTER — Other Ambulatory Visit: Payer: Self-pay

## 2020-02-01 ENCOUNTER — Ambulatory Visit: Payer: Commercial Managed Care - PPO

## 2020-02-01 DIAGNOSIS — R928 Other abnormal and inconclusive findings on diagnostic imaging of breast: Secondary | ICD-10-CM

## 2020-08-14 ENCOUNTER — Other Ambulatory Visit: Payer: Self-pay | Admitting: Physician Assistant

## 2020-08-14 ENCOUNTER — Ambulatory Visit
Admission: RE | Admit: 2020-08-14 | Discharge: 2020-08-14 | Disposition: A | Discharge status: Home / Self Care | Payer: Commercial Managed Care - PPO | Source: Ambulatory Visit | Attending: Physician Assistant | Admitting: Physician Assistant

## 2020-08-14 DIAGNOSIS — R059 Cough, unspecified: Secondary | ICD-10-CM

## 2020-12-23 ENCOUNTER — Other Ambulatory Visit: Payer: Self-pay | Admitting: Family Medicine

## 2020-12-23 DIAGNOSIS — Z1231 Encounter for screening mammogram for malignant neoplasm of breast: Secondary | ICD-10-CM

## 2021-02-16 ENCOUNTER — Other Ambulatory Visit: Payer: Self-pay

## 2021-02-16 ENCOUNTER — Ambulatory Visit
Admission: RE | Admit: 2021-02-16 | Discharge: 2021-02-16 | Disposition: A | Discharge status: Home / Self Care | Payer: Commercial Managed Care - PPO | Source: Ambulatory Visit | Attending: Family Medicine | Admitting: Family Medicine

## 2021-02-16 DIAGNOSIS — Z1231 Encounter for screening mammogram for malignant neoplasm of breast: Secondary | ICD-10-CM

## 2021-10-26 ENCOUNTER — Other Ambulatory Visit: Payer: Self-pay | Admitting: Family Medicine

## 2021-10-26 DIAGNOSIS — Z1231 Encounter for screening mammogram for malignant neoplasm of breast: Secondary | ICD-10-CM

## 2022-01-25 IMAGING — MG MM DIGITAL SCREENING BILAT W/ TOMO AND CAD
8 series · 9 of 24 positions shown · non-contrast
Comparison: Previous exam(s).

CLINICAL DATA: Screening.

EXAM:
DIGITAL SCREENING BILATERAL MAMMOGRAM WITH TOMOSYNTHESIS AND CAD
TECHNIQUE: Bilateral screening digital craniocaudal and mediolateral oblique
mammograms were obtained. Bilateral screening digital breast
tomosynthesis was performed. The images were evaluated with
computer-aided detection.

[L CC synth-2D]
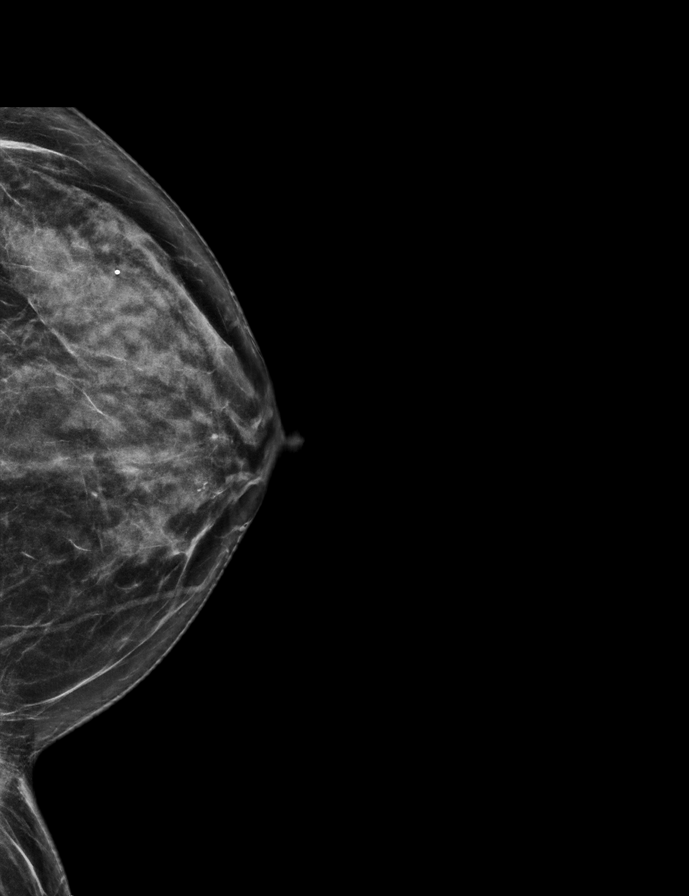

[R CC synth-2D]
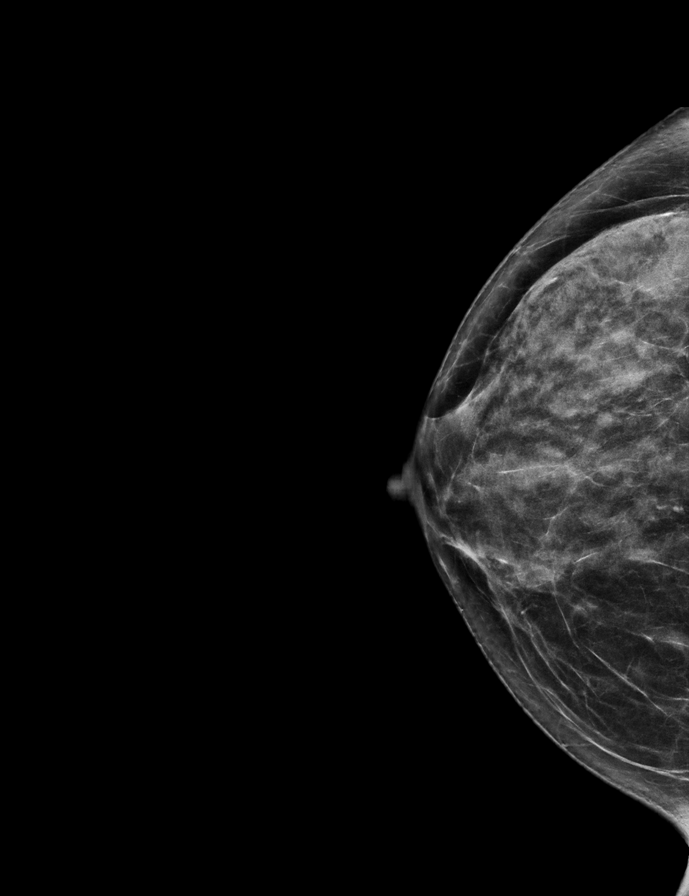

[L MLO synth-2D]
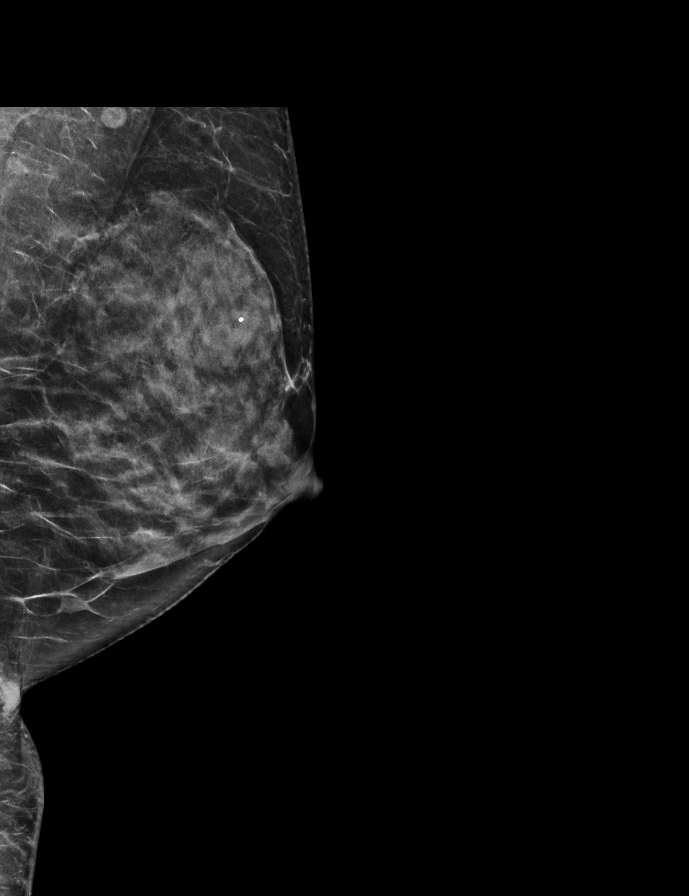

[R MLO synth-2D]
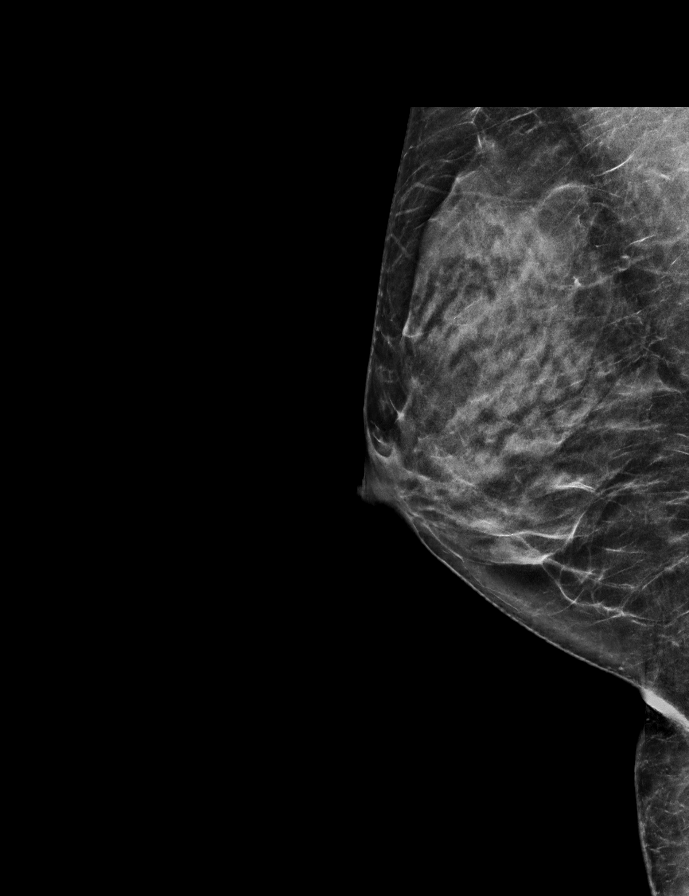

[L MLO tomo · 2 of 61 frames shown]
[frame 20/61]
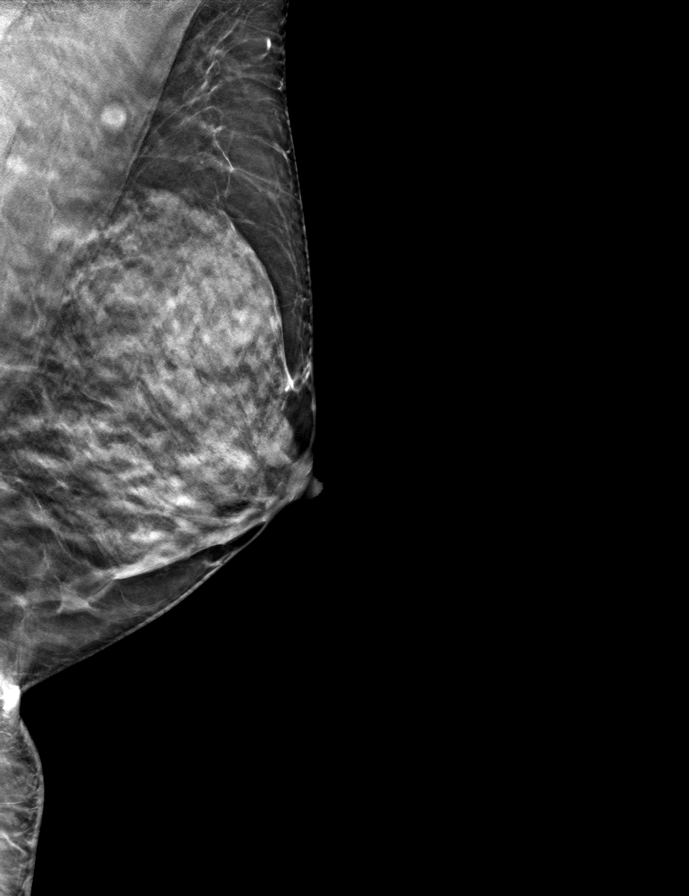
[frame 31/61]
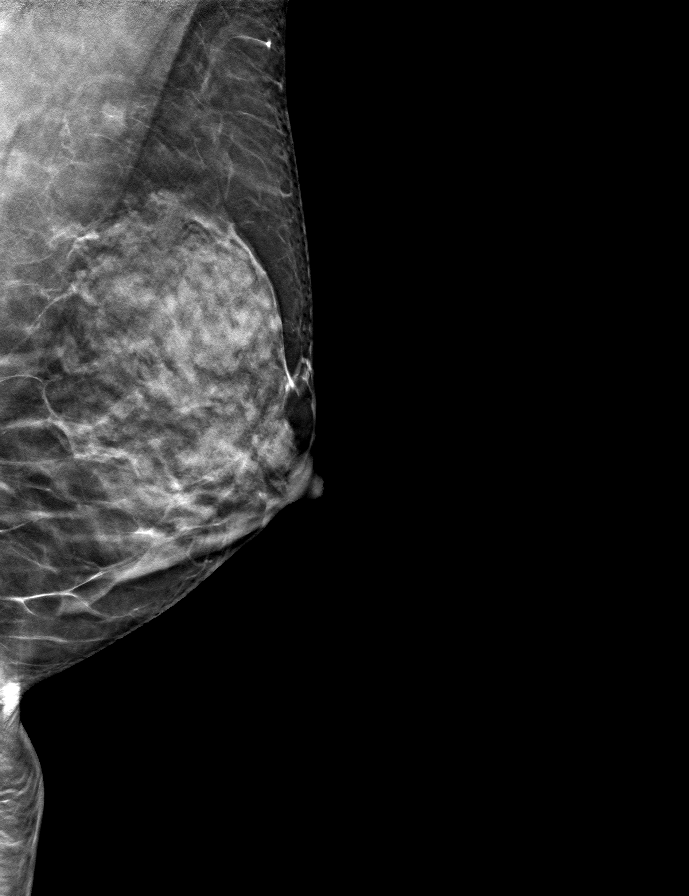

[R MLO tomo · tomo slice 31/61.0]
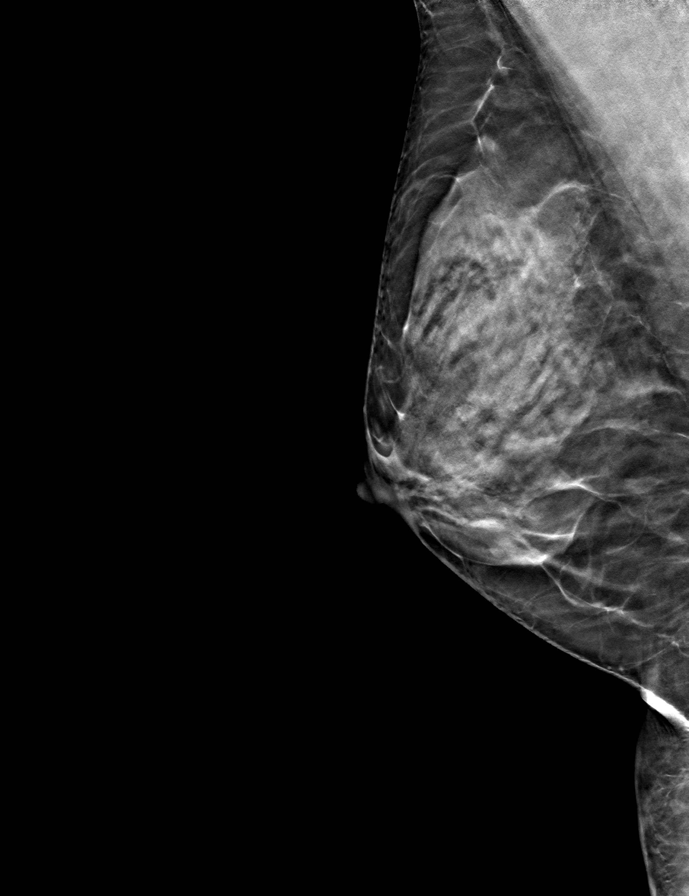

[R CC tomo · tomo slice 31/62.0]
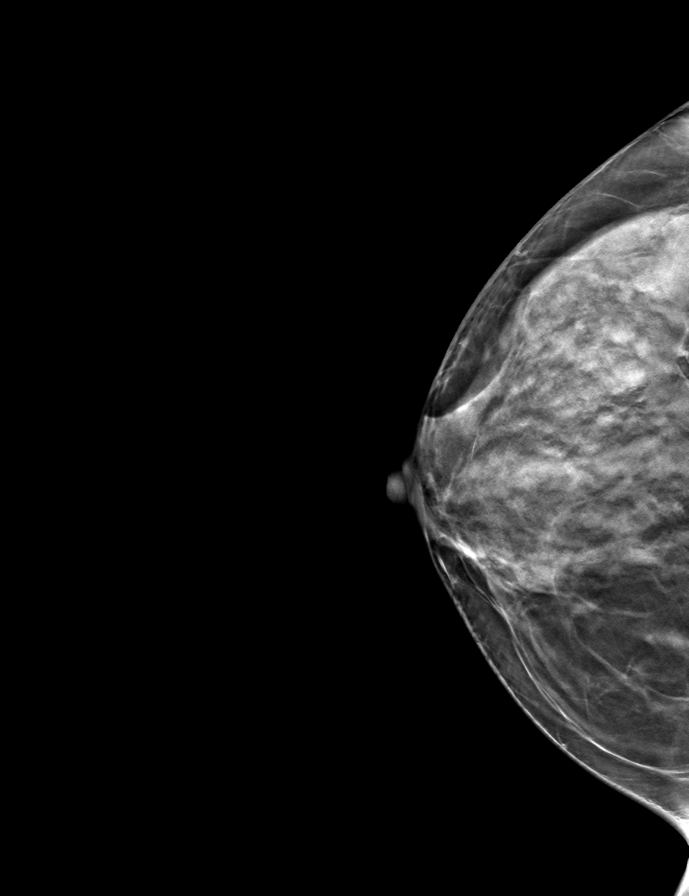

[L CC tomo · tomo slice 31/61.0]
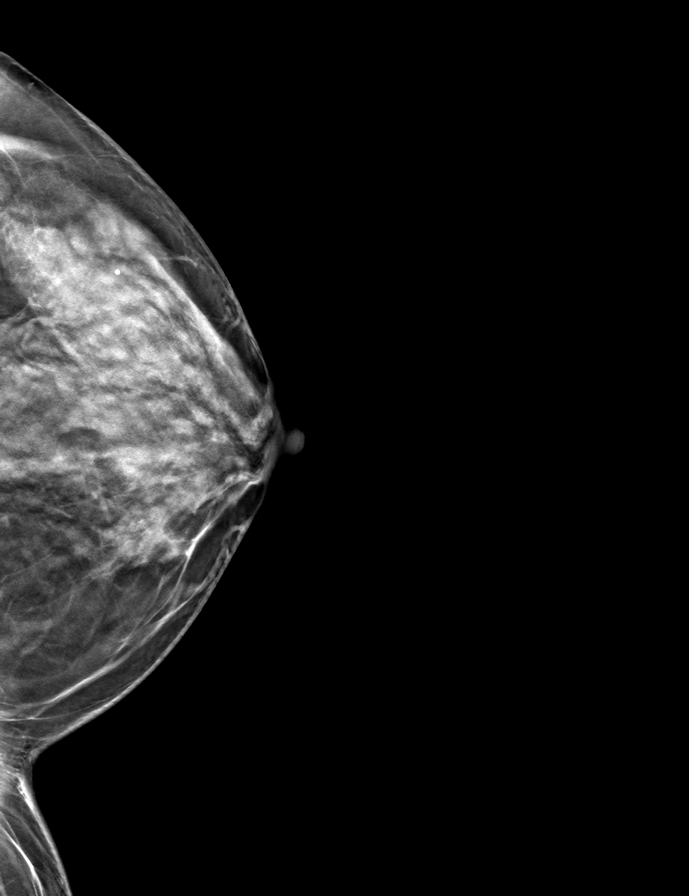

[9 of 24 positions shown; findings below may reference images not displayed]

ACR Breast Density Category d: The breast tissue is extremely dense,
which lowers the sensitivity of mammography
FINDINGS: There are no findings suspicious for malignancy.
IMPRESSION: No mammographic evidence of malignancy. A result letter of this
screening mammogram will be mailed directly to the patient.

RECOMMENDATION:
Screening mammogram in one year. (Code:TA-V-WV9)

BI-RADS CATEGORY  1: Negative.

## 2022-02-22 ENCOUNTER — Ambulatory Visit
Admission: RE | Admit: 2022-02-22 | Discharge: 2022-02-22 | Disposition: A | Discharge status: Home / Self Care | Payer: Commercial Managed Care - PPO | Source: Ambulatory Visit | Attending: Family Medicine | Admitting: Family Medicine

## 2022-02-22 DIAGNOSIS — Z1231 Encounter for screening mammogram for malignant neoplasm of breast: Secondary | ICD-10-CM

## 2023-01-17 ENCOUNTER — Other Ambulatory Visit: Payer: Self-pay | Admitting: Family Medicine

## 2023-01-17 DIAGNOSIS — Z1231 Encounter for screening mammogram for malignant neoplasm of breast: Secondary | ICD-10-CM

## 2023-02-24 ENCOUNTER — Ambulatory Visit
Admission: RE | Admit: 2023-02-24 | Discharge: 2023-02-24 | Disposition: A | Discharge status: Home / Self Care | Payer: Commercial Managed Care - PPO | Source: Ambulatory Visit | Attending: Family Medicine | Admitting: Family Medicine

## 2023-02-24 DIAGNOSIS — Z1231 Encounter for screening mammogram for malignant neoplasm of breast: Secondary | ICD-10-CM

## 2024-02-23 ENCOUNTER — Other Ambulatory Visit: Payer: Self-pay | Admitting: Family Medicine

## 2024-02-23 DIAGNOSIS — Z0000 Encounter for general adult medical examination without abnormal findings: Secondary | ICD-10-CM

## 2024-03-06 ENCOUNTER — Ambulatory Visit
Admission: RE | Admit: 2024-03-06 | Discharge: 2024-03-06 | Disposition: A | Discharge status: Home / Self Care | Source: Ambulatory Visit | Attending: Family Medicine | Admitting: Family Medicine

## 2024-03-06 DIAGNOSIS — Z0000 Encounter for general adult medical examination without abnormal findings: Secondary | ICD-10-CM
